# Patient Record
Sex: Female | Born: 1965 | Race: Black or African American | Hispanic: No | Marital: Single | Smoking: Never smoker
Health system: Southern US, Community
[De-identification: ages and names within clinical notes are randomized; demographics above are authoritative.]

## PROBLEM LIST (undated history)

## (undated) DIAGNOSIS — I10 Essential (primary) hypertension: Secondary | ICD-10-CM

---

## 2015-10-06 ENCOUNTER — Emergency Department: Admit: 2015-10-06 | Payer: Self-pay | Primary: Family Medicine

## 2015-10-06 ENCOUNTER — Inpatient Hospital Stay
Admit: 2015-10-06 | Discharge: 2015-10-07 | Disposition: A | Discharge status: Home / Self Care | Payer: Self-pay | Attending: Emergency Medicine

## 2015-10-06 DIAGNOSIS — M545 Low back pain: Secondary | ICD-10-CM

## 2015-10-06 LAB — CBC WITH AUTOMATED DIFF
ABS. BASOPHILS: 0 10*3/uL (ref 0.0–0.1)
ABS. EOSINOPHILS: 0 10*3/uL (ref 0.0–0.4)
ABS. LYMPHOCYTES: 1.5 10*3/uL (ref 0.8–3.5)
ABS. MONOCYTES: 0.5 10*3/uL (ref 0.0–1.0)
ABS. NEUTROPHILS: 2.1 10*3/uL (ref 1.8–8.0)
BASOPHILS: 1 % (ref 0–1)
EOSINOPHILS: 0 % (ref 0–7)
HCT: 42.6 % (ref 35.0–47.0)
HGB: 13.5 g/dL (ref 11.5–16.0)
LYMPHOCYTES: 36 % (ref 12–49)
MCH: 29.2 PG (ref 26.0–34.0)
MCHC: 31.7 g/dL (ref 30.0–36.5)
MCV: 92.2 FL (ref 80.0–99.0)
MONOCYTES: 13 % (ref 5–13)
NEUTROPHILS: 50 % (ref 32–75)
PLATELET: 165 10*3/uL (ref 150–400)
RBC: 4.62 M/uL (ref 3.80–5.20)
RDW: 15.4 % — ABNORMAL HIGH (ref 11.5–14.5)
WBC: 4.1 10*3/uL (ref 3.6–11.0)

## 2015-10-06 LAB — METABOLIC PANEL, BASIC
Anion gap: 11 mmol/L (ref 5–15)
BUN/Creatinine ratio: 12 (ref 12–20)
BUN: 19 MG/DL (ref 6–20)
CO2: 26 mmol/L (ref 21–32)
Calcium: 8.4 MG/DL — ABNORMAL LOW (ref 8.5–10.1)
Chloride: 104 mmol/L (ref 97–108)
Creatinine: 1.6 MG/DL — ABNORMAL HIGH (ref 0.55–1.02)
GFR est AA: 41 mL/min/{1.73_m2} — ABNORMAL LOW (ref >60)
GFR est non-AA: 34 mL/min/{1.73_m2} — ABNORMAL LOW (ref >60)
Glucose: 96 mg/dL (ref 65–100)
Potassium: 3.7 mmol/L (ref 3.5–5.1)
Sodium: 141 mmol/L (ref 136–145)

## 2015-10-06 MED ORDER — SODIUM CHLORIDE 0.9% BOLUS IV
0.9 % | INTRAVENOUS | Status: AC
Start: 2015-10-06 — End: 2015-10-06
  Administered 2015-10-06: 23:00:00 via INTRAVENOUS

## 2015-10-06 MED FILL — SODIUM CHLORIDE 0.9 % IV: INTRAVENOUS | Qty: 1000

## 2015-10-06 NOTE — ED Triage Notes (Addendum)
Pt reports generalized body aches/stiffness for "awhile". Pt reports it has gotten worse over the past 2 days. Pt reports reports 2 nights ago she got on the floor of the bathroom because her back was hurting, pt reports she was unable to get back up and was on the ground for 9 hours.

## 2015-10-06 NOTE — ED Notes (Signed)
Patient discharged by provider, given PCP referral as requested

## 2015-10-06 NOTE — ED Notes (Signed)
Patient given discharge instruction by provider.  Verbalized understanding, pt discharge home with family and was able to ambulate.

## 2015-10-06 NOTE — ED Provider Notes (Signed)
HPI Comments: 50 y.o. 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  The pt reports that she has felt generally weak since last April.  She further explained "I was walking to the bathroom 2 nights ago I had to sit down on the tile floor and I laid there for 9 hours."  She states that "my legs just feel weak."  She denies any numbness to her legs.  She denies any urinary retention.  There are no other acute medical complaints at this time.  Denies fever, chills, HA, dizziness, light headedness, dyspnea, chest pain, abdominal pain, N/V/D, melena, hematochezia, loss of bowel/bladder functioning, saddle anesthesia, urinary symptoms or any other acute medical conditions.      PCP: Manus Rudd, MD    Eliezer Champagne, PA-C      Patient is a 50 y.o. female presenting with general illness.   Generalized Body Aches   Pertinent negatives include no chest pain, no abdominal pain and no shortness of breath.        Past Medical History:   Diagnosis Date   ??? Arthritis    ??? Ill-defined condition     over active bladder       History reviewed. No pertinent surgical history.      History reviewed. No pertinent family history.    Social History     Social History   ??? Marital status: SINGLE     Spouse name: N/A   ??? Number of children: N/A   ??? Years of education: N/A     Occupational History   ??? Not on file.     Social History Main Topics   ??? Smoking status: Never Smoker   ??? Smokeless tobacco: Never Used   ??? Alcohol use Yes      Comment: occ   ??? Drug use: No   ??? Sexual activity: Not on file     Other Topics Concern   ??? Not on file     Social History Narrative   ??? No narrative on file         ALLERGIES: Review of patient's allergies indicates no known allergies.    Review of Systems   Constitutional: Negative for activity change, appetite change, diaphoresis and fever.   HENT: Negative for ear discharge, ear pain, facial swelling, rhinorrhea,  sore throat, tinnitus, trouble swallowing and voice change.    Eyes: Negative for photophobia, pain, discharge, redness and visual disturbance.   Respiratory: Negative for cough, chest tightness, shortness of breath, wheezing and stridor.    Cardiovascular: Negative for chest pain and palpitations.   Gastrointestinal: Negative for abdominal pain, constipation, diarrhea, nausea and vomiting.   Endocrine: Negative for polydipsia and polyuria.   Genitourinary: Negative for dysuria, flank pain and hematuria.   Musculoskeletal: Negative for arthralgias, back pain and myalgias.   Skin: Negative for color change and rash.   Neurological: Negative for dizziness, syncope, speech difficulty, light-headedness and numbness.   Psychiatric/Behavioral: Negative for behavioral problems.       Vitals:    10/06/15 1725   BP: (!) 147/91   Pulse: 88   Resp: 14   Temp: 98.1 ??F (36.7 ??C)   SpO2: 98%   Weight: 96.6 kg (213 lb)   Height:  (1.676 m)            Physical Exam   Constitutional: She is oriented to person, place, and time. She appears  well-developed and well-nourished.   HENT:   Head: Normocephalic and atraumatic.   Eyes: Conjunctivae are normal. Pupils are equal, round, and reactive to light. Right eye exhibits no discharge. Left eye exhibits no discharge.   Neck: Normal range of motion. Neck supple. No thyromegaly present.   Cardiovascular: Normal rate, regular rhythm and normal heart sounds.  Exam reveals no gallop and no friction rub.    No murmur heard.  Pulmonary/Chest: Effort normal and breath sounds normal. No respiratory distress. She has no wheezes.   Abdominal: Soft. Bowel sounds are normal. She exhibits no distension. There is no tenderness. There is no rebound and no guarding.   Musculoskeletal: Normal range of motion. She exhibits no edema, tenderness or deformity.   No C, T, L, S spine tenderness.  Pt has full mobility of upper and lower extremities.  Pt is able to ambulate without difficulty.   No perineal  numbness.  Pt is NVI.    Neurological: She is alert and oriented to person, place, and time.   Skin: Skin is warm.   Psychiatric: She has a normal mood and affect.        MDM  Number of Diagnoses or Management Options  Low back pain, unspecified back pain laterality, unspecified chronicity, with sciatica presence unspecified:   Diagnosis management comments: Pt presents with complaints of leg weakness.  Workup revealed elevated creatinine and possible UTI.  Will cover with keflex and refer to family doctor for further evaluation of symptoms.  Also provided name of rheumatologist.  Reviewed treatment plan with attending and they agree.  Eliezer Champagneavid B Ndia Sampath, PA-C     ED Course       Procedures

## 2015-10-06 NOTE — ED Notes (Signed)
Bedside and Verbal shift change report given to Elnita Maxwellheryl RN (oncoming nurse) by Laymond PurserKatie RN (offgoing nurse). Report included the following information SBAR, Kardex, ED Summary and MAR.

## 2015-10-07 LAB — URINALYSIS W/MICROSCOPIC
Blood: NEGATIVE
Glucose: NEGATIVE mg/dL
Nitrites: NEGATIVE
Protein: 30 mg/dL — AB
Specific gravity: 1.028 (ref 1.003–1.030)
Urobilinogen: 1 EU/dL (ref 0.2–1.0)
pH (UA): 5 (ref 5.0–8.0)

## 2015-10-07 LAB — CK: CK: 280 U/L — ABNORMAL HIGH (ref 26–192)

## 2015-10-07 LAB — BILIRUBIN, CONFIRM: Bilirubin UA, confirm: NEGATIVE

## 2015-10-07 MED ORDER — CEPHALEXIN 500 MG CAP
500 mg | ORAL_CAPSULE | Freq: Two times a day (BID) | ORAL | 0 refills | Status: AC
Start: 2015-10-07 — End: 2015-10-13

## 2015-10-12 ENCOUNTER — Encounter: Attending: Rheumatology | Primary: Family Medicine

## 2015-10-20 ENCOUNTER — Encounter: Attending: Rheumatology | Primary: Family Medicine

## 2015-12-26 ENCOUNTER — Ambulatory Visit: Admit: 2015-12-26 | Discharge: 2015-12-26 | Attending: Family Medicine | Primary: Family Medicine

## 2015-12-26 DIAGNOSIS — M25561 Pain in right knee: Secondary | ICD-10-CM

## 2015-12-26 NOTE — Progress Notes (Signed)
Chief Complaint   Patient presents with   ??? Establish Care   ??? Form Completion     1. Have you been to the ER, urgent care clinic since your last visit?  Hospitalized since your last visit? N/A    2. Have you seen or consulted any other health care providers outside of the [facility] since your last visit?  Include any pap smears or colon screening. N/A

## 2015-12-26 NOTE — Patient Instructions (Signed)
Mediterranean Diet: Care Instructions  Your Care Instructions  The Mediterranean diet features foods eaten in [place], [place], southern [place] and [place], and other countries that border the [place] Sea. It emphasizes eating a diet rich in fruits, vegetables, nuts, and high-fiber grains, and limits meat, cheese, and sweets.  The Mediterranean diet may:  ?? Prevent heart disease and lower the risk of a heart attack or stroke.  ?? Prevent type 2 diabetes.  ?? Prevent Alzheimer's disease and other dementia.  ?? Prevent depression.  ?? Prevent Parkinson's disease.  This diet contains more fat than other heart-healthy diets. But the fats are mainly from nuts, unsaturated oils, such as fish oils, olive oil, and certain nut or seed oils (such as canola, soybean, or flaxseed oil). These types of oils may help protect the heart and blood vessels.  Follow-up care is a key part of your treatment and safety. Be sure to make and go to all appointments, and call your doctor if you are having problems. It's also a good idea to know your test results and keep a list of the medicines you take.  How can you care for yourself at home?  What to eat  ?? Eat a variety of fruits and vegetables each day, such as grapes, blueberries, tomatoes, broccoli, peppers, figs, olives, spinach, eggplant, beans, lentils, and chickpeas.  ?? Eat a variety of whole-grain foods each day, such as oats, brown rice, and whole wheat bread, pasta, and couscous.  ?? Eat fish at least 2 times a week. Try tuna, salmon, mackerel, lake trout, herring, or sardines.  ?? Eat moderate amounts of low-fat dairy products, such as milk, cheese, or yogurt.  ?? Eat moderate amounts of poultry and eggs.  ?? Choose healthy (unsaturated) fats, such as nuts, olive oil and certain nut or seed oils like canola, soybean, and flaxseed.  ?? Limit unhealthy (saturated) fats, such as butter, palm oil, and coconut oil. And limit fats found in animal products, such as meat and dairy  products made with whole milk. Try to eat red meat only a few times a month in very small amounts.  ?? Limit sweets and desserts to only a few times a week. This includes sugar-sweetened drinks like soda.  The Mediterranean diet may also include red wine with your meal???1 glass each day for women and up to 2 glasses a day for men.  Tips for changing your diet  ?? Dip bread in a mix of olive oil and fresh herbs instead of using butter.  ?? Add avocado slices to your sandwich instead of bacon.  ?? Have fish for lunch or dinner instead of red meat. Brush the fish with olive oil, and broil or grill it.  ?? Sprinkle your salad with seeds or nuts instead of cheese.  ?? Cook with olive or canola oil instead of butter or oils that are high in saturated fat.  ?? Switch from 2% milk or whole milk to 1% or fat-free milk.  ?? Dip raw vegetables in a vinaigrette dressing or hummus instead of dips made from mayonnaise or sour cream.  ?? Have a piece of fruit for dessert instead of a piece of cake. Try baked apples, or have some dried fruit.  Part of the Mediterranean diet is being active. Get at least 30 minutes of exercise on most days of the week. Walking is a good choice. You also may want to do other activities, such as running, swimming, cycling, or playing tennis or team sports.    Where can you learn more?  Go to http://www.healthwise.net/[employer].  Enter O407 in the search box to learn more about "Mediterranean Diet: Care Instructions."  Current as of: May 06, 2015  Content Version: 11.2  ?? 2006-2017 Healthwise[employer]. Care instructions adapted under license by [employer] (which disclaims liability or warranty for this information). If you have questions about a medical condition or this instruction, always ask your healthcare professional. Healthwise, Incorporated disclaims any warranty or liability for your use of this information.

## 2015-12-26 NOTE — Progress Notes (Addendum)
Linda Jennings is an 50 y.o. female who presents with chief concern of driving evaluation.    She was new to area in [place] [facility] and missed a turn going to a friends house.  She turned down a wrong street and was pulled over by police.  Police officer asked her and evaluated for DUI.  She had a negative Breath analyzer. During the straight line test, she had difficulty and mentioned she had severe acute knee pain after colliding with a metal cart at work earlier that week.     Officer put in to [facility] that she needed evaluation for knee pain.   She since moved and didn't get her mail initially and now has a document stating her licence is being revoked.  She is here for evaluation and states she has no pain.      Obesity:  Body mass index is 41.64 kg/(m^2). is very high.  She states she has not been compliant with her exercise but is trying to get back in shape.  Motivation is "high" for weight loss"      Detailed ROS below.       Review of Systems, positives are bolded, strike through if denied.    GEN:    CV:      Pulm:    Abd/GI:   Psych:    Neuro:     ENT:      Endocrine:     GU:      MSK:      Skin:    Lower Ext:           Current and past medical information:  I personally reviewed and included updated list below.    Past Medical History:   Diagnosis Date   ??? Arthritis    ??? Ill-defined condition     over active bladder       No Known Allergies    History reviewed. No pertinent surgical history.    Social History     Social History   ??? Marital status: UNKNOWN     Spouse name: N/A   ??? Number of children: N/A   ??? Years of education: N/A     Social History Main Topics   ??? Smoking status: Never Smoker   ??? Smokeless tobacco: Never Used   ??? Alcohol use Yes      Comment: occ   ??? Drug use: No   ??? Sexual activity: Not Asked     Other Topics Concern   ??? None     Social History Narrative           Physical Exam, Abnormal/pertinent findings bolded,     Visit Vitals   ??? BP 128/87   ??? Pulse 63    ??? Temp 97.3 ??F (36.3 ??C) (Oral)   ??? Resp 16   ??? Ht  (1.676 m)   ??? Wt 258 lb (117 kg)   ??? SpO2 95%   ??? BMI 41.64 kg/m2          GEN:    Alert and Oriented, No acute distress  Psych:  Mood appropriate, No pressured speech, linear thoughts  CV:    Regular Rhythm, S1 and S2 audible, no MRG, no palpable thrills  Pulm:    CTA B/L, no wheezes/rubs  GI/Abd:   Non-tender to palpation, normal bowel sounds x4, no masses, (-) involuntary or voluntary guarding  Neuro:   Lucid, No focal deficits  ENT:    EOMI, Non-icteric sclera, MMM  Neck:   Trachea midline, no lymphadenopathy  GU:    No suprapubic tenderness  MSK:  Normal varus and valgus testing, negative McMurray, no joint line tenderness. Normal gait, FROM in all four extremities  Skin:   No visible Rash, Ecchymosis, or Excoriations  Lower Ext: No edema, No tenderness to palpation, No palpable cords        Assessment/PLAN    1. Right knee pain, unspecified chronicity  Resolved.  She has no active knee pain.  - dmv document completed and to be scanned to chart.     2. Essential hypertension  BP initially elevated. Improved with rest. Continue to monitor.     3. Obesity due to excess calories, unspecified obesity severity  - Discussed weight loss plan in detail.  She is interested in healthy lifestyle and given information of Mediterranean diet.   I have reviewed/discussed the above normal BMI with the patient.  I have recommended the following interventions: dietary management education, guidance, and counseling, encourage exercise, monitor weight and prescribed dietary intake . Marland Kitchen.      Follow-up Disposition: Not on File  For next visit follow up weight management.     Plan of care:  Discussed diagnoses in detail with patient.   Medication risks/benefits/side effects discussed with patient.   All of the patient's questions were addressed. The patient understands and agrees with our plan of care.  The patient knows to call back if they are unsure of or forget any changes  we discussed today or if the symptoms change.     The patient received an After-Visit Summary which contains VS, orders, medication list and allergy list. This can be used as a "mini-medical record" should they have to seek medical care while out of town.    Gilles ChiquitoJonathan W Gilberto Stanforth, DO  Resident note, PGY-3  Patient discussed with Precept Physician, Dr. Bunnie PhilipsMarcee Vest    No future appointments.        Current Medications after this visit::       Current Outpatient Prescriptions   Medication Sig   ??? oxybutynin (DITROPAN) 5 mg tablet Take 5 mg by mouth three (3) times daily.   ??? diclofenac EC (VOLTAREN) 75 mg EC tablet Take 75 mg by mouth.     No current facility-administered medications for this visit.

## 2015-12-26 NOTE — Progress Notes (Signed)
I reviewed with the resident the medical history and the resident's findings on the physical examination.  I discussed with the resident the patient's diagnosis and concur with the plan.

## 2016-02-06 ENCOUNTER — Ambulatory Visit: Admit: 2016-02-06 | Discharge: 2016-02-06 | Attending: Family Medicine | Primary: Family Medicine

## 2016-02-06 DIAGNOSIS — G8929 Other chronic pain: Secondary | ICD-10-CM

## 2016-02-06 NOTE — Patient Instructions (Signed)
You can go to optometrist at [facility] as it may be cheaper than seeing an opthalmologist

## 2016-02-06 NOTE — Progress Notes (Signed)
I reviewed with the resident the medical history and the resident's findings on the physical examination.  I discussed with the resident the patient's diagnosis and concur with the plan.

## 2016-02-06 NOTE — Progress Notes (Signed)
Visit Vitals   ??? BP (!) 145/96   ??? Pulse 63   ??? Temp 97.7 ??F (36.5 ??C) (Oral)   ??? Resp 20   ??? Ht 5\' 6"  (1.676 m)   ??? Wt 261 lb (118.4 kg)   ??? SpO2 97%   ??? BMI 42.13 kg/m2     Chief Complaint   Patient presents with   ??? 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

## 2016-02-06 NOTE — Progress Notes (Signed)
Subjective  Linda Jennings is an 50 y.o. female .    Patient presents for evaluation.    She previously saw Dr. Derenda Fennel to have forms for [facility] completed.     [place] and was going to her friends house when she missed the turn and went down the wrong street. A police officer noticed her and stopped her for concern for DUI. She had a negative breath analyzer. She had difficulty during the straihgt line test but she states it is because of chronic knee pain that she's had.     She was deemed to have failed the sobriety test and her license was revoked pending medical eval.    Dr. Derenda Fennel completed paperwork on June 12th 2017; however some parts are missing and DMV needs more info provided.      Allergies - reviewed:   No Known Allergies      Medications - reviewed:   Current Outpatient Prescriptions   Medication Sig   ??? oxybutynin (DITROPAN) 5 mg tablet Take 5 mg by mouth three (3) times daily.   ??? diclofenac EC (VOLTAREN) 75 mg EC tablet Take 75 mg by mouth.     No current facility-administered medications for this visit.          Past Medical History - reviewed:  Past Medical History:   Diagnosis Date   ??? Arthritis    ??? Ill-defined condition     over active bladder         Immunizations - reviewed:     There is no immunization history on file for this patient.      ROS  Review of Systems : A complete review of systems as performed and is negative except for those mentioned in the HPI.    Physical Exam  Visit Vitals   ??? BP (!) 145/96   ??? Pulse 63   ??? Temp 97.7 ??F (36.5 ??C) (Oral)   ??? Resp 20   ??? Ht 5\' 6"  (1.676 m)   ??? Wt 261 lb (118.4 kg)   ??? SpO2 97%   ??? BMI 42.13 kg/m2       General appearance - Alert, NAD.   Respiratory - LCTAB. No wheeze/rale/rhonchi  Heart - Normal rate, regular rhythm. No m/r/r  Neurological - No focal deficits. Speech normal.   Musculoskeletal - Normal ROM, Gait normal.    Extremities - No LE edema. Crepitus in left patella   Psych - mood stressed due to all of this but happy overall. Affect pleasant. No hi or si. Thought content linear. Judgement and insight seem fair    Assessment/Plan  1. Chronic pain of right knee: resolved. Could have contributed to unsteady gait that night especially given negative breathalyzer.  Completed missing pieces to forms today  PMP reviewed and nothing popped up, so no narcotic use documented in [place] area  Patient will go to optho for eye/vision portion of paperwork that must be completed    Follow up as needed    I discussed the aforementioned diagnoses with the patient as well as the plan of care.     Benjamine Mola, MD  Family Medicine Resident  PGY 3

## 2016-03-07 NOTE — Telephone Encounter (Signed)
Per Dr. Tiburcio PeaHarris, Lauren has already taken care of this request.

## 2016-03-07 NOTE — Telephone Encounter (Signed)
Per call from Patient, DMV forms were completed and faxed for her, however DMV reports missing information on form. Patient states they have faxed form here  About 10 minutes ago and asking this be completed ASAP. Patient states there is still a hold on her license because of missing information and patient states she need to work.    [facility] call pt [phone]    thanks

## 2016-10-18 ENCOUNTER — Emergency Department (HOSPITAL_COMMUNITY): Payer: Self-pay

## 2016-10-18 ENCOUNTER — Emergency Department (HOSPITAL_COMMUNITY)
Admission: EM | Admit: 2016-10-18 | Discharge: 2016-10-19 | Disposition: A | Discharge status: Home / Self Care | Payer: Self-pay | Attending: Emergency Medicine | Admitting: Emergency Medicine

## 2016-10-18 DIAGNOSIS — M791 Myalgia, unspecified site: Secondary | ICD-10-CM

## 2016-10-18 DIAGNOSIS — N390 Urinary tract infection, site not specified: Secondary | ICD-10-CM | POA: Insufficient documentation

## 2016-10-18 DIAGNOSIS — R42 Dizziness and giddiness: Secondary | ICD-10-CM | POA: Insufficient documentation

## 2016-10-18 LAB — COMPREHENSIVE METABOLIC PANEL
ALBUMIN: 3.5 g/dL (ref 3.5–5.0)
ALK PHOS: 86 U/L (ref 38–126)
ALT: 21 U/L (ref 14–54)
ANION GAP: 8 (ref 5–15)
AST: 23 U/L (ref 15–41)
BILIRUBIN TOTAL: 0.5 mg/dL (ref 0.3–1.2)
BUN: 11 mg/dL (ref 6–20)
CALCIUM: 8.9 mg/dL (ref 8.9–10.3)
CO2: 25 mmol/L (ref 22–32)
CREATININE: 0.96 mg/dL (ref 0.44–1.00)
Chloride: 108 mmol/L (ref 101–111)
GFR calc Af Amer: >60 mL/min (ref >60)
GFR calc non Af Amer: >60 mL/min (ref >60)
GLUCOSE: 76 mg/dL (ref 65–99)
Potassium: 3.6 mmol/L (ref 3.5–5.1)
Sodium: 141 mmol/L (ref 135–145)
TOTAL PROTEIN: 7.2 g/dL (ref 6.5–8.1)

## 2016-10-18 LAB — URINALYSIS, ROUTINE W REFLEX MICROSCOPIC
BILIRUBIN URINE: NEGATIVE
Glucose, UA: NEGATIVE mg/dL
KETONES UR: 5 mg/dL — AB
Nitrite: NEGATIVE
PROTEIN: 30 mg/dL — AB
Specific Gravity, Urine: 1.028 (ref 1.005–1.030)
pH: 5 (ref 5.0–8.0)

## 2016-10-18 LAB — CBC WITH DIFFERENTIAL/PLATELET
BASOS ABS: 0 10*3/uL (ref 0.0–0.1)
BASOS PCT: 0 %
Eosinophils Absolute: 0.2 10*3/uL (ref 0.0–0.7)
Eosinophils Relative: 4 %
HEMATOCRIT: 40.4 % (ref 36.0–46.0)
HEMOGLOBIN: 13.2 g/dL (ref 12.0–15.0)
LYMPHS PCT: 32 %
Lymphs Abs: 1.9 10*3/uL (ref 0.7–4.0)
MCH: 30.3 pg (ref 26.0–34.0)
MCHC: 32.7 g/dL (ref 30.0–36.0)
MCV: 92.7 fL (ref 78.0–100.0)
MONO ABS: 0.3 10*3/uL (ref 0.1–1.0)
Monocytes Relative: 6 %
NEUTROS ABS: 3.4 10*3/uL (ref 1.7–7.7)
NEUTROS PCT: 58 %
Platelets: 220 10*3/uL (ref 150–400)
RBC: 4.36 MIL/uL (ref 3.87–5.11)
RDW: 14.7 % (ref 11.5–15.5)
WBC: 5.8 10*3/uL (ref 4.0–10.5)

## 2016-10-18 LAB — CK: Total CK: 291 U/L — ABNORMAL HIGH (ref 38–234)

## 2016-10-18 MED ORDER — CEPHALEXIN 250 MG PO CAPS
500.0000 mg | ORAL_CAPSULE | Freq: Once | ORAL | Status: AC
  Administered 2016-10-18: 500 mg via ORAL
  Filled 2016-10-18: qty 2

## 2016-10-18 MED ORDER — CEPHALEXIN 500 MG PO CAPS: ORAL_CAPSULE | ORAL | 0 refills | Status: DC

## 2016-10-18 NOTE — ED Notes (Signed)
Patient transported to CT 

## 2016-10-18 NOTE — ED Notes (Signed)
Family expressed concern about pt still being unbalanced and requested for MD to return to room.

## 2016-10-18 NOTE — ED Triage Notes (Signed)
Pt c/o stiffness in her legs making it unable to walk without asst,.  Pt also has bil feet and ankle swelling.  Pt st's onset of stiffness over a year ago but has gotten worse over past few weeks.

## 2016-10-18 NOTE — ED Notes (Signed)
MD at bedside. 

## 2016-10-19 NOTE — ED Provider Notes (Signed)
[facility] DEPT Provider Note   CSN: [id number] Arrival date & time: 10/18/16  1633     History   Chief Complaint Chief Complaint  Patient presents with  . Leg Swelling    HPI Brittany Hartman is a 51 y.o. female.  HPI  On initial examination patient states that she is here for 2 years of progressively worsening leg swelling and overall weakness. Since she is to build exercise and then she's been too weak exercise over the last couple years. Doesn't have any shortness of breath or chest pain. No history of heart failure. She's been evaluated for this in the past until his arthritis. She does move the area and seems to get worse so she came here for further evaluation.  Time of discharge her cousin stated that she was really here because she was having balance issues and was walking "funny". This apparently had been progressively worsening as well. Her cousin states that sometimes her leg will get stiff and she can't move it.  No past medical history on file.  There are no active problems to display for this patient.   No past surgical history on file.  OB History    No data available       Home Medications    Prior to Admission medications   Medication Sig Start Date End Date Taking? Authorizing Provider  cephALEXin (KEFLEX) 500 MG capsule 2 caps po bid x 7 days 10/18/16   Marily Memos, MD    Family History No family history on file.  Social History Social History  Substance Use Topics  . Smoking status: Not on file  . Smokeless tobacco: Not on file  . Alcohol use Not on file     Allergies   Patient has no known allergies.   Review of Systems Review of Systems  All other systems reviewed and are negative.    Physical Exam Updated Vital Signs BP (!) 178/99   Pulse (!) 58   Temp 97.9 F (36.6 C) (Oral)   Resp 18   Ht 5\' 6"  (1.676 m)   Wt 225 lb (102.1 kg)   SpO2 100%   BMI 36.32 kg/m   Physical Exam  Constitutional: She is oriented to  person, place, and time. She appears well-developed and well-nourished.  HENT:  Head: Normocephalic and atraumatic.  Eyes: Conjunctivae and EOM are normal.  Neck: Normal range of motion.  Cardiovascular: Normal rate and regular rhythm.   Pulmonary/Chest: No stridor. No respiratory distress.  Abdominal: Soft. She exhibits no distension.  Musculoskeletal: Normal range of motion. She exhibits no edema or deformity.  Neurological: She is alert and oriented to person, place, and time.  No altered mental status, able to give full seemingly accurate history.  Face is symmetric, EOM's intact, pupils equal and reactive, vision intact, tongue and uvula midline without deviation Upper and Lower extremity motor 5/5, intact pain perception in distal extremities, 2+ reflexes in biceps, patella and achilles tendons. Finger to nose normal, heel to shin normal. Walks without assistance or evident ataxia but has a slightly wide, shuffling type of gait with both feet, holds on to walls to move.    Skin: Skin is warm and dry.  Nursing note and vitals reviewed.    ED Treatments / Results  Labs (all labs ordered are listed, but only abnormal results are displayed) Labs Reviewed  CK - Abnormal; Notable for the following:       Result Value   Total CK 291 (*)  All other components within normal limits  URINALYSIS, ROUTINE W REFLEX MICROSCOPIC - Abnormal; Notable for the following:    Color, Urine AMBER (*)    APPearance TURBID (*)    Hgb urine dipstick MODERATE (*)    Ketones, ur 5 (*)    Protein, ur 30 (*)    Leukocytes, UA TRACE (*)    Bacteria, UA MANY (*)    Squamous Epithelial / LPF 6-30 (*)    All other components within normal limits  URINE CULTURE  CBC WITH DIFFERENTIAL/PLATELET  COMPREHENSIVE METABOLIC PANEL    EKG  EKG Interpretation None       Radiology Ct Head Wo Contrast  Result Date: 10/18/2016 CLINICAL DATA:  Lightheadedness and vertigo. EXAM: CT HEAD WITHOUT CONTRAST  TECHNIQUE: Contiguous axial images were obtained from the base of the skull through the vertex without intravenous contrast. COMPARISON:  None. FINDINGS: Brain: There is no intracranial hemorrhage, mass or evidence of acute infarction. There is mild generalized atrophy. There is mild chronic microvascular ischemic change. There is no significant extra-axial fluid collection. Benign hyperostosis calcifications incidentally noted. No acute intracranial findings are evident. Vascular: No hyperdense vessel or unexpected calcification. Skull: Normal. Negative for fracture or focal lesion. Sinuses/Orbits: No acute finding. Other: None. IMPRESSION: No acute intracranial findings. There is mild generalized atrophy and chronic appearing white matter hypodensities which likely represent small vessel ischemic disease. Electronically Signed   By: Ellery Plunk M.D.   On: 10/18/2016 23:50    Procedures Procedures (including critical care time)  Medications Ordered in ED Medications  cephALEXin (KEFLEX) capsule 500 mg (500 mg Oral Given 10/18/16 2245)     Initial Impression / Assessment and Plan / ED Course  I have reviewed the triage vital signs and the nursing notes.  Pertinent labs & imaging results that were available during my care of the patient were reviewed by me and considered in my medical decision making (see chart for details).     Workup in the ER is negative. Considered Guillain-Barr as a possible cause but over 2 years think this is unlikely. Patient could have some atypical form of multiple sclerosis but would need neurology follow-up for that.  She could also have some mild muscular dystrophy with her slightly elevated CK and proximal leg symptoms but this is also something that neurology can follow-up.  Her CT here is negative and don't think at the central nervous cause such as stroke or brain bleed especially with her chronicity.  Information for neurology given for which the cousin  will help the patient follow-up. Otherwise return here for any new or worsening symptoms.  Final Clinical Impressions(s) / ED Diagnoses   Final diagnoses:  Muscle soreness  Urinary tract infection without hematuria, site unspecified    New Prescriptions Discharge Medication List as of 10/18/2016 11:57 PM    START taking these medications   Details  cephALEXin (KEFLEX) 500 MG capsule 2 caps po bid x 7 days, Print         Marily Memos, MD 10/19/16 2214

## 2016-10-20 LAB — URINE CULTURE

## 2016-10-24 ENCOUNTER — Encounter (HOSPITAL_COMMUNITY): Payer: Self-pay

## 2016-10-24 ENCOUNTER — Inpatient Hospital Stay (HOSPITAL_COMMUNITY)
Admission: EM | Admit: 2016-10-24 | Discharge: 2016-10-29 | DRG: 059 | Disposition: A | Discharge status: Rehabilitation Facility | Payer: Self-pay | Attending: Internal Medicine | Admitting: Internal Medicine

## 2016-10-24 ENCOUNTER — Emergency Department (HOSPITAL_COMMUNITY): Payer: Self-pay

## 2016-10-24 DIAGNOSIS — R269 Unspecified abnormalities of gait and mobility: Secondary | ICD-10-CM

## 2016-10-24 DIAGNOSIS — Z8249 Family history of ischemic heart disease and other diseases of the circulatory system: Secondary | ICD-10-CM

## 2016-10-24 DIAGNOSIS — I10 Essential (primary) hypertension: Secondary | ICD-10-CM | POA: Diagnosis present

## 2016-10-24 DIAGNOSIS — R531 Weakness: Secondary | ICD-10-CM

## 2016-10-24 DIAGNOSIS — G35D Multiple sclerosis, unspecified: Secondary | ICD-10-CM | POA: Diagnosis present

## 2016-10-24 DIAGNOSIS — N183 Chronic kidney disease, stage 3 unspecified: Secondary | ICD-10-CM

## 2016-10-24 DIAGNOSIS — R739 Hyperglycemia, unspecified: Secondary | ICD-10-CM

## 2016-10-24 DIAGNOSIS — N319 Neuromuscular dysfunction of bladder, unspecified: Secondary | ICD-10-CM

## 2016-10-24 DIAGNOSIS — N3001 Acute cystitis with hematuria: Secondary | ICD-10-CM | POA: Diagnosis present

## 2016-10-24 DIAGNOSIS — M62838 Other muscle spasm: Secondary | ICD-10-CM

## 2016-10-24 DIAGNOSIS — G35 Multiple sclerosis: Principal | ICD-10-CM | POA: Diagnosis present

## 2016-10-24 DIAGNOSIS — T380X5A Adverse effect of glucocorticoids and synthetic analogues, initial encounter: Secondary | ICD-10-CM

## 2016-10-24 DIAGNOSIS — E876 Hypokalemia: Secondary | ICD-10-CM | POA: Diagnosis not present

## 2016-10-24 DIAGNOSIS — K592 Neurogenic bowel, not elsewhere classified: Secondary | ICD-10-CM

## 2016-10-24 HISTORY — DX: Essential (primary) hypertension: I10

## 2016-10-24 LAB — CBC WITH DIFFERENTIAL/PLATELET
Basophils Absolute: 0 10*3/uL (ref 0.0–0.1)
Basophils Relative: 0 %
Eosinophils Absolute: 0.2 10*3/uL (ref 0.0–0.7)
Eosinophils Relative: 2 %
HCT: 43.2 % (ref 36.0–46.0)
HEMOGLOBIN: 14.1 g/dL (ref 12.0–15.0)
LYMPHS ABS: 2.3 10*3/uL (ref 0.7–4.0)
LYMPHS PCT: 36 %
MCH: 30.1 pg (ref 26.0–34.0)
MCHC: 32.6 g/dL (ref 30.0–36.0)
MCV: 92.3 fL (ref 78.0–100.0)
MONOS PCT: 5 %
Monocytes Absolute: 0.3 10*3/uL (ref 0.1–1.0)
NEUTROS PCT: 57 %
Neutro Abs: 3.6 10*3/uL (ref 1.7–7.7)
Platelets: 204 10*3/uL (ref 150–400)
RBC: 4.68 MIL/uL (ref 3.87–5.11)
RDW: 14.3 % (ref 11.5–15.5)
WBC: 6.4 10*3/uL (ref 4.0–10.5)

## 2016-10-24 LAB — COMPREHENSIVE METABOLIC PANEL
ALT: 28 U/L (ref 14–54)
AST: 21 U/L (ref 15–41)
Albumin: 4 g/dL (ref 3.5–5.0)
Alkaline Phosphatase: 93 U/L (ref 38–126)
Anion gap: 13 (ref 5–15)
BUN: 13 mg/dL (ref 6–20)
CHLORIDE: 105 mmol/L (ref 101–111)
CO2: 26 mmol/L (ref 22–32)
Calcium: 9.7 mg/dL (ref 8.9–10.3)
Creatinine, Ser: 1.01 mg/dL — ABNORMAL HIGH (ref 0.44–1.00)
GFR calc Af Amer: >60 mL/min (ref >60)
Glucose, Bld: 92 mg/dL (ref 65–99)
POTASSIUM: 3.5 mmol/L (ref 3.5–5.1)
SODIUM: 144 mmol/L (ref 135–145)
Total Bilirubin: 0.7 mg/dL (ref 0.3–1.2)
Total Protein: 7.9 g/dL (ref 6.5–8.1)

## 2016-10-24 LAB — URINALYSIS, ROUTINE W REFLEX MICROSCOPIC
Bilirubin Urine: NEGATIVE
Glucose, UA: NEGATIVE mg/dL
Ketones, ur: NEGATIVE mg/dL
NITRITE: NEGATIVE
PH: 6 (ref 5.0–8.0)
Protein, ur: NEGATIVE mg/dL
SPECIFIC GRAVITY, URINE: 1.024 (ref 1.005–1.030)

## 2016-10-24 LAB — ETHANOL: Alcohol, Ethyl (B): <5 mg/dL (ref <5)

## 2016-10-24 MED ORDER — SODIUM CHLORIDE 0.9 % IV SOLN
500.0000 mg | Freq: Two times a day (BID) | INTRAVENOUS | Status: DC
  Administered 2016-10-25 – 2016-10-28 (×8): 500 mg via INTRAVENOUS
  Filled 2016-10-24 (×9): qty 4

## 2016-10-24 NOTE — ED Triage Notes (Signed)
Pt presents to the ed because her legs gave out from under her, states that her legs are very stiff in the mornings.  She has been having this problem on and off for 6 months.  Today she was walking and her legs got stiff and gave out from under her again.  Denies any other symptoms such as dizziness, chest pain, shortness of breath or weakness.

## 2016-10-24 NOTE — ED Provider Notes (Signed)
Emergency Department Provider Note   I have reviewed the triage vital signs and the nursing notes.   HISTORY  Chief Complaint Gait Problem   HPI Brittany Hartman is a 51 y.o. female presents to the emergency department for evaluation of gait instability and frequent falling. Patient states that she was seen in the emergency department recently and had a normal CT scan of the head and lab work. She was referred to primary care physician who she was supposed to see today but lost her balance and fell to the ground prior to this appointment and was transported by EMS to the emergency department. During the encounter today the patient states she was going down the stairs when she suddenly lost her balance and fell forward. She denies any syncope, loss of consciousness, head trauma. No back or neck pain. No pain in the arms or legs. She did not have any chest pain, difficulty breathing, or heart palpitations prior to the fall.   Patient states that her balance and gait issues have been ongoing for at least 6 months. She denies any numbness or weakness in the arms or legs. She had plans to follow up with the primary care physician today regarding control of her blood pressure. No sudden worsening back pain, fever, or chills.    Past Medical History:  Diagnosis Date  . Hypertension     Patient Active Problem List   Diagnosis Date Noted  . Multiple sclerosis (HCC) 10/24/2016  . Essential hypertension 10/24/2016  . Acute cystitis with hematuria 10/24/2016    History reviewed. No pertinent surgical history.    Allergies Patient has no known allergies.  Family History  Problem Relation Age of Onset  . Heart attack Mother   . Hypertension Brother     Social History Social History  Substance Use Topics  . Smoking status: Never Smoker  . Smokeless tobacco: Never Used  . Alcohol use No    Review of Systems  Constitutional: No fever/chills Eyes: No visual changes. ENT: No  sore throat. Cardiovascular: Denies chest pain. Respiratory: Denies shortness of breath. Gastrointestinal: No abdominal pain.  No nausea, no vomiting.  No diarrhea.  No constipation. Genitourinary: Negative for dysuria. Musculoskeletal: Negative for back pain.  Skin: Negative for rash. Neurological: Negative for headaches and numbness. Positive LE weakness and significant difficulty walking.   10-point ROS otherwise negative.  ____________________________________________   PHYSICAL EXAM:  VITAL SIGNS: ED Triage Vitals  Enc Vitals Group     BP 10/24/16 1611 (!) 184/109     Pulse Rate 10/24/16 1611 (!) 54     Resp 10/24/16 1611 18     Temp 10/24/16 1611 97.9 F (36.6 C)     Temp Source 10/24/16 1611 Oral     SpO2 10/24/16 1611 98 %     Weight 10/24/16 1610 225 lb (102.1 kg)     Height 10/24/16 1610 5\' 8"  (1.727 m)   Constitutional: Alert and oriented. Well appearing and in no acute distress. Eyes: Conjunctivae are normal. PERRL. EOMI. Head: Atraumatic. Nose: No congestion/rhinnorhea. Mouth/Throat: Mucous membranes are moist.   Neck: No stridor.   Cardiovascular: Normal rate, regular rhythm. Good peripheral circulation. Grossly normal heart sounds.   Respiratory: Normal respiratory effort.  No retractions. Lungs CTAB. Gastrointestinal: Soft and nontender. No distention.  Musculoskeletal: No lower extremity tenderness nor edema. No gross deformities of extremities. Neurologic:  Normal speech and language. No weakness/numbness in the upper or lower extremities. Normal CN exam 2-12. 2+ patellar reflexes  bilaterally. Significant staggering, wide-based gait.  Skin:  Skin is warm, dry and intact. No rash noted. Psychiatric: Mood and affect are normal. Speech and behavior are normal.  ____________________________________________   LABS (all labs ordered are listed, but only abnormal results are displayed)  Labs Reviewed  COMPREHENSIVE METABOLIC PANEL - Abnormal; Notable for the  following:       Result Value   Creatinine, Ser 1.01 (*)    All other components within normal limits  URINALYSIS, ROUTINE W REFLEX MICROSCOPIC - Abnormal; Notable for the following:    Color, Urine AMBER (*)    APPearance TURBID (*)    Hgb urine dipstick MODERATE (*)    Leukocytes, UA TRACE (*)    Bacteria, UA MANY (*)    Squamous Epithelial / LPF 6-30 (*)    All other components within normal limits  URINE CULTURE  CBC WITH DIFFERENTIAL/PLATELET  ETHANOL  CBC  ANTINUCLEAR ANTIBODIES, IFA  SJOGRENS SYNDROME-A EXTRACTABLE NUCLEAR ANTIBODY  SJOGRENS SYNDROME-B EXTRACTABLE NUCLEAR ANTIBODY  HIV ANTIBODY (ROUTINE TESTING)  HIV ANTIBODY (ROUTINE TESTING)   ____________________________________________  EKG   EKG Interpretation  Date/Time:  Wednesday October 24 2016 19:30:26 EDT Ventricular Rate:  57 PR Interval:  132 QRS Duration: 72 QT Interval:  424 QTC Calculation: 412 R Axis:   3 Text Interpretation:  Sinus bradycardia Septal infarct , age undetermined Abnormal ECG No STEMI.  Confirmed by Kinsler Soeder MD, Elianna Windom [phone]) on 10/24/2016 11:43:31 PM Also confirmed by Palmer Shorey MD, Braycen Burandt [phone]), editor [place]  CCT, [place] ([id number])  on 10/25/2016 7:45:56 AM       ____________________________________________  RADIOLOGY  Mr Laqueta Jean Wo Contrast  Result Date: 10/24/2016 CLINICAL DATA:  Leg stiffness, legs gave out today. Similar symptoms intermittently for 6 months. Evaluate gait abnormality. History of hypertension. EXAM: MRI HEAD WITHOUT CONTRAST TECHNIQUE: Multiplanar, multiecho pulse sequences of the brain and surrounding structures were obtained without intravenous contrast. COMPARISON:  CT HEAD October 18, 2016 FINDINGS: BRAIN: At least 6 infratentorial white matter lesions (including brainstem) infarct greater than 10 supratentorial white matter lesions which predominately radiate from the periventricular margin with low T1 signal compatible with black holes of demyelination. Multifocal  T2 shine through and scattered areas of equivocal, possibly hyperacute demyelination. Faint enhancement within the LEFT superior cerebellar peduncle, RIGHT periventricular frontal lobe, RIGHT frontal lobe, rostrum of the corpus callosum and to lesser extent along the periphery of LEFT periatrial white matter lesion. 7 mm LEFT basal ganglia lesion. LEFT parietal small cortical lesion. Dominant 11 mm LEFT frontal lobe lesion. Mild parenchymal brain volume loss for age. A few scattered chronic micro hemorrhages. No abnormal extra-axial fluid collections cord enhancement. VASCULAR: Normal major intracranial vascular flow voids present at skull base. SKULL AND UPPER CERVICAL SPINE: No abnormal sellar expansion. No suspicious calvarial bone marrow signal. Craniocervical junction maintained. SINUSES/ORBITS: The mastoid air-cells and included paranasal sinuses are well-aerated. The included ocular globes and orbital contents are non-suspicious. OTHER: None. IMPRESSION: Severe chronic supra- and infratentorial demyelination with superimposed hyperacute and acute demyelinating lesions including LEFT superior cerebellar peduncle. Mild parenchymal brain volume loss for age. Electronically Signed   By: Awilda Metro M.D.   On: 10/24/2016 21:32    ____________________________________________   PROCEDURES  Procedure(s) performed:   Procedures  None ____________________________________________   INITIAL IMPRESSION / ASSESSMENT AND PLAN / ED COURSE  Pertinent labs & imaging results that were available during my care of the patient were reviewed by me and considered in my medical decision making (see chart  for details).  Patient resents to the emergency department for reevaluation of gait instability. In review of her recent ED visit the patient had gait instability noted there with negative head CT and lab work. She was referred to both primary care physician and neurologist. She had an additional fall  today. Her gait abnormality shows a wide based gait with frequent hesitancy. She requires minimal assistance. Does not seem significantly worse from prior but persistent.   Spoke with Dr. Amada Jupiter with Neurology who will see the patient tonight. Recommends 500 mg IV methylprednisone BID and hospitalist admission. Discussed MRI findings and diagnosis along with plan for admission with the patient in detail.   Discussed patient's case with hospitalist, Dr. Maryfrances Bunnell. Patient and family (if present) updated with plan. Care transferred to hospitalist service.  I reviewed all nursing notes, vitals, pertinent old records, EKGs, labs, imaging (as available).   _________________________________  FINAL CLINICAL IMPRESSION(S) / ED DIAGNOSES  Final diagnoses:  MS (multiple sclerosis) (HCC)     MEDICATIONS GIVEN DURING THIS VISIT:  Medications  methylPREDNISolone sodium succinate (SOLU-MEDROL) 500 mg in sodium chloride 0.9 % 50 mL IVPB (500 mg Intravenous Given 10/25/16 0920)  cephALEXin (KEFLEX) capsule 1,000 mg (1,000 mg Oral Given 10/25/16 0919)  enoxaparin (LOVENOX) injection 40 mg (40 mg Subcutaneous Given 10/25/16 0920)  acetaminophen (TYLENOL) tablet 650 mg (not administered)    Or  acetaminophen (TYLENOL) suppository 650 mg (not administered)  ibuprofen (ADVIL,MOTRIN) tablet 400 mg (not administered)  amLODipine (NORVASC) tablet 5 mg (5 mg Oral Given 10/25/16 0051)     NEW OUTPATIENT MEDICATIONS STARTED DURING THIS VISIT:  None   Note:  This document was prepared using Dragon voice recognition software and may include unintentional dictation errors.  Alona Bene, MD Emergency Medicine   Maia Plan, MD 10/25/16 [phone]

## 2016-10-24 NOTE — ED Notes (Addendum)
Pt ambulated self efficiently with O2 sat beginning at 92%. While ambulating pt O2 sat dropped to 90%. When ambulated back to bedside O2 sat was at 84% and climbed to 88%. Pt was placed back on O2 @ 2L.

## 2016-10-24 NOTE — H&P (Signed)
History and Physical  Patient Name: Brittany Hartman     ZOX:[id number]    DOB: [birth year]    DOA: 10/24/2016 PCP: No PCP Per Patient   Patient coming from: Home  Chief Complaint: Leg stiffness  HPI: Brittany Hartman is a 51 y.o. female with a past medical history significant for hypertension untreated who presents with 6 months leg stiffness.  The patient has had 6 months of intermittent leg "stiffness" and abnormalities of gait.  In the last month, she moved to [facility], and this problem has worsened.  One week ago, she was seen in the ER for "soreness" and "balance issues" and "walking funny", diagnosed with UTI and started on cephalexin.  Today, she was walking down some stairs when she had her legs give out and fell, iwthout LOC, palpitations, chest discomfort or dyspnea.    ED course: -Afebrile, heart rate 54, respirationsa nd pulse ox normal, BP 184/109 -Na 144, K 3.5, Cr 1.01, WBC 6.4K, Hgb 14.1 -Alcohol level negative -Urinalysis showed persistent hematuria -MR brain was obtained which showed new lesions consistent with multiple sclerosis -The case was discussed with neurology who recommended admission, high-dose IV steroids     ROS: Review of Systems  Neurological: Positive for dizziness and focal weakness.       Abnormal taste on left tongue last week  All other systems reviewed and are negative.         Past Medical History:  Diagnosis Date  . Hypertension     History reviewed. No pertinent surgical history.  Social History: Patient lives with her cousin right now.  She is from [place] area, moved down here in March.  Was a Interior and spatial designer, currently unemployed.  The patient walks unassisted.  Does not smoke or use alcohol or illicit drugs.    No Known Allergies  Family history: family history includes Heart attack in her mother; Hypertension in her brother.  Prior to Admission medications   Medication Sig Start Date End Date Taking? Authorizing Provider    cephALEXin (KEFLEX) 500 MG capsule 2 caps po bid x 7 days Patient taking differently: Take 1,000 mg by mouth 2 (two) times daily. 2 caps po bid x 7 days 10/18/16  Yes Marily Memos, MD       Physical Exam: BP (!) 187/96   Pulse (!) 53   Temp 98 F (36.7 C)   Resp 18   Ht 5\' 8"  (1.727 m)   Wt 102.1 kg (225 lb)   SpO2 98%   BMI 34.21 kg/m  General appearance: Well-developed, adult female, alert and in no acut distress.   Eyes: Anicteric, conjunctiva pink, lids and lashes normal. PERRL.    ENT: No nasal deformity, discharge, epistaxis.  Hearing normal. OP moist without lesions.   Neck: No neck masses.  Trachea midline.  No thyromegaly/tenderness. Lymph: No cervical or supraclavicular lymphadenopathy. Skin: Warm and dry. No suspicious rashes or lesions. Cardiac: RRR, nl S1-S2, no murmurs appreciated.  Capillary refill is brisk.  JVP not visible.  No LE edema.  Radial pulses 2+ and symmetric. Respiratory: Normal respiratory rate and rhythm.  CTAB without rales or wheezes. Abdomen: Abdomen soft.  No TTP. No ascites, distension, hepatosplenomegaly.   MSK: No deformities or effusions.  No cyanosis or clubbing. Neuro: Cranial nerves normal.  Sensation intact to light touch. Speech is fluent.  Muscle strength seems slightly less in left hip flexion, otherwise seems symmetic and 5/5.    Psych: Sensorium intact and responding to questions, attention normal.  Behavior appropriate.  Affect normal.  Judgment and insight appear normal.     Labs on Admission:  I have personally reviewed following labs and imaging studies: CBC:  Recent Labs Lab 10/18/16 2150 10/24/16 1928  WBC 5.8 6.4  NEUTROABS 3.4 3.6  HGB 13.2 14.1  HCT 40.4 43.2  MCV 92.7 92.3  PLT 220 204   Basic Metabolic Panel:  Recent Labs Lab 10/18/16 2150 10/24/16 1928  NA 141 144  K 3.6 3.5  CL 108 105  CO2 25 26  GLUCOSE 76 92  BUN 11 13  CREATININE 0.96 1.01*  CALCIUM 8.9 9.7   GFR: Estimated Creatinine  Clearance: 82.4 mL/min (A) (by C-G formula based on SCr of 1.01 mg/dL (H)).  Liver Function Tests:  Recent Labs Lab 10/18/16 2150 10/24/16 1928  AST 23 21  ALT 21 28  ALKPHOS 86 93  BILITOT 0.5 0.7  PROT 7.2 7.9  ALBUMIN 3.5 4.0   No results for input(s): LIPASE, AMYLASE in the last 168 hours. No results for input(s): AMMONIA in the last 168 hours. Coagulation Profile: No results for input(s): INR, PROTIME in the last 168 hours. Cardiac Enzymes:  Recent Labs Lab 10/18/16 2150  CKTOTAL 291*   BNP (last 3 results) No results for input(s): PROBNP in the last 8760 hours. HbA1C: No results for input(s): HGBA1C in the last 72 hours. CBG: No results for input(s): GLUCAP in the last 168 hours. Lipid Profile: No results for input(s): CHOL, HDL, LDLCALC, TRIG, CHOLHDL, LDLDIRECT in the last 72 hours. Thyroid Function Tests: No results for input(s): TSH, T4TOTAL, FREET4, T3FREE, THYROIDAB in the last 72 hours. Anemia Panel: No results for input(s): VITAMINB12, FOLATE, FERRITIN, TIBC, IRON, RETICCTPCT in the last 72 hours. Sepsis Labs: Invalid input(s): PROCALCITONIN, LACTICIDVEN Recent Results (from the past 240 hour(s))  Urine culture     Status: Abnormal   Collection Time: 10/18/16 10:41 PM  Result Value Ref Range Status   Specimen Description URINE, CLEAN CATCH  Final   Special Requests NONE  Final   Culture MULTIPLE SPECIES PRESENT, SUGGEST RECOLLECTION (A)  Final   Report Status 10/20/2016 FINAL  Final         Radiological Exams on Admission: Personally reviewed MR brain report: Mr Laqueta Jean Wo Contrast  Result Date: 10/24/2016 CLINICAL DATA:  Leg stiffness, legs gave out today. Similar symptoms intermittently for 6 months. Evaluate gait abnormality. History of hypertension. EXAM: MRI HEAD WITHOUT CONTRAST TECHNIQUE: Multiplanar, multiecho pulse sequences of the brain and surrounding structures were obtained without intravenous contrast. COMPARISON:  CT HEAD October 18, 2016 FINDINGS: BRAIN: At least 6 infratentorial white matter lesions (including brainstem) infarct greater than 10 supratentorial white matter lesions which predominately radiate from the periventricular margin with low T1 signal compatible with black holes of demyelination. Multifocal T2 shine through and scattered areas of equivocal, possibly hyperacute demyelination. Faint enhancement within the LEFT superior cerebellar peduncle, RIGHT periventricular frontal lobe, RIGHT frontal lobe, rostrum of the corpus callosum and to lesser extent along the periphery of LEFT periatrial white matter lesion. 7 mm LEFT basal ganglia lesion. LEFT parietal small cortical lesion. Dominant 11 mm LEFT frontal lobe lesion. Mild parenchymal brain volume loss for age. A few scattered chronic micro hemorrhages. No abnormal extra-axial fluid collections cord enhancement. VASCULAR: Normal major intracranial vascular flow voids present at skull base. SKULL AND UPPER CERVICAL SPINE: No abnormal sellar expansion. No suspicious calvarial bone marrow signal. Craniocervical junction maintained. SINUSES/ORBITS: The mastoid air-cells and included paranasal sinuses  are well-aerated. The included ocular globes and orbital contents are non-suspicious. OTHER: None. IMPRESSION: Severe chronic supra- and infratentorial demyelination with superimposed hyperacute and acute demyelinating lesions including LEFT superior cerebellar peduncle. Mild parenchymal brain volume loss for age. Electronically Signed   By: Awilda Metro M.D.   On: 10/24/2016 21:32    EKG: Independently reviewed. Rate 57, QTc 412, normal sinus rhythm.    Assessment/Plan  1. Multiple sclerosis:  New onset. -Solu-medrol 500 mg BID for three days, then per Neuro -Neurology, cosultation appreciated   2. Hypertension:  Previously diagnosed, thinks she was on amlodipine.  Not currently treated. -Restart amlodipine  3. Urinary tract infection:  Resolving.   -Recollect  urine which had multiple species before. -Continue last 3 doses of cephalexin             DVT prophylaxis: Lovenox  Code Status: FULL  Family Communication: None present  Disposition Plan: Anticipate IV solumedrol and start BP meds and further disposition per Nerology. Consults called: Neurology Admission status: INPATIENT          Medical decision making: Patient seen at 10:45 PM on 10/24/2016.  The patient was discussed with Dr. Gladis Riffle.  What exists of the patient's chart was reviewed in depth and summarized above.  Clinical condition: stable.        Alberteen Sam [facility] Pager [phone]

## 2016-10-24 NOTE — ED Notes (Signed)
Admitting doctor  And dr Amada Jupiter in to see

## 2016-10-24 NOTE — Consult Note (Signed)
Neurology Consultation Reason for Consult: gait change Referring Physician: Mariel Aloe  CC: Worsening gait dysfunction  History is obtained from: Patient  HPI: Tyasia Rippetoe is a 51 y.o. female with a history of progressive gait dysfunction for the past 6 years. She first noticed it about 6 years ago, and is gotten slightly worse intervening time. Over the past few days, however, it has gotten markedly worse and she has noticed worsening stiffness of her legs.  For this reason she presented to the emergency department where a MRI of the brain was obtained which demonstrates findings typical of multiple sclerosis   ROS: A 14 point ROS was performed and is negative except as noted in the HPI.   Past Medical History:  Diagnosis Date  . Hypertension      Family history: Cousin-fibromyalgia   Social History:  reports that she has never smoked. She has never used smokeless tobacco. She reports that she does not drink alcohol. Her drug history is not on file.   Exam: Current vital signs: BP (!) 187/96   Pulse (!) 53   Temp 98 F (36.7 C)   Resp 18   Ht 5\' 8"  (1.727 m)   Wt 102.1 kg (225 lb)   SpO2 98%   BMI 34.21 kg/m  Vital signs in last 24 hours: Temp:  [97.9 F (36.6 C)-98 F (36.7 C)] 98 F (36.7 C) (04/11 2238) Pulse Rate:  [53-54] 53 (04/11 2238) Resp:  [18] 18 (04/11 2238) BP: (184-187)/(96-109) 187/96 (04/11 2238) SpO2:  [98 %] 98 % (04/11 1611) Weight:  [102.1 kg (225 lb)] 102.1 kg (225 lb) (04/11 1610)   Physical Exam  Constitutional: Appears well-developed and well-nourished.  Psych: Affect appropriate to situation Eyes: No scleral injection HENT: No OP obstrucion Head: Normocephalic.  Cardiovascular: Normal rate and regular rhythm.  Respiratory: Effort normal and breath sounds normal to anterior ascultation GI: Soft.  No distension. There is no tenderness.  Skin: WDI  Neuro: Mental Status: Patient is awake, alert, oriented to person, place, month,  year, and situation. Patient is able to give a clear and coherent history. No signs of aphasia or neglect Cranial Nerves: II: Visual Fields are full. Pupils are equal, round, and reactive to light.   III,IV, VI: EOMI without ptosis or diploplia.  V: Facial sensation is symmetric to temperature VII: Facial movement is symmetric.  VIII: hearing is intact to voice X: Uvula elevates symmetrically XI: Shoulder shrug is symmetric. XII: tongue is midline without atrophy or fasciculations.  Motor: Tone is normal. Bulk is normal. 5/5 strength was present in bilateral arms, she has give way weakness of bilateral legs Sensory: Sensation is symmetric to light touch and temperature in the arms and legs. Deep Tendon Reflexes: 2+ and symmetric in the patellae. No clonus Plantars: Toes are downgoing bilaterally.  Cerebellar: She has significant difficulty with finger-nose-finger bilaterally, with some past pointing, slightly worse on the right than the left. she also has difficulty with heel-knee-shin  I have reviewed labs in epic and the results pertinent to this consultation are: CMP-unremarkable  I have reviewed the images obtained: MRI brain-T2 lesions in at least 3 of the 4 locations specified by McDonald criteria(juxtacortical, periventricular, infratentorial). Spinal cord was not imaged.  Impression: 51 year old female with what is almost certainly multiple sclerosis with acute flare. She will need to be admitted for IV steroids. Though I think mimics are unlikely, given the characteristic nature of her MRI, we'll send ANA, SSA+B, HIV.  Recommendations: 1) ANA,  SSA+B, HIV 2) IV Solu-Medrol 500 mg twice a day for 3-5 days 3) if stiffness continues to be a problem after treatment, could consider adding baclofen 2.5 mg 3 times a day 4) MRI cervical and thoracic spine to obtain a baseline. 5) neurology will continue to follow   Ritta Slot, MD [facility] [phone]  If 7pm- 7am, please page neurology on call as listed in AMION.

## 2016-10-24 NOTE — ED Notes (Signed)
Phlebotomy called to draw labs.

## 2016-10-24 NOTE — ED Notes (Signed)
Pt was given meal bag and sprite.

## 2016-10-24 NOTE — ED Notes (Signed)
Pt can eat/ drink per admitting MD.

## 2016-10-24 NOTE — ED Notes (Signed)
Pt sitting in chair waiting for the edp to tell her about her mri  Wants food to eat   Not yet  Need an order

## 2016-10-25 LAB — HIV ANTIBODY (ROUTINE TESTING W REFLEX)
HIV SCREEN 4TH GENERATION: NONREACTIVE
HIV SCREEN 4TH GENERATION: NONREACTIVE

## 2016-10-25 LAB — CBC
HCT: 41.4 % (ref 36.0–46.0)
HEMOGLOBIN: 13.4 g/dL (ref 12.0–15.0)
MCH: 29.8 pg (ref 26.0–34.0)
MCHC: 32.4 g/dL (ref 30.0–36.0)
MCV: 92 fL (ref 78.0–100.0)
Platelets: 222 10*3/uL (ref 150–400)
RBC: 4.5 MIL/uL (ref 3.87–5.11)
RDW: 14.6 % (ref 11.5–15.5)
WBC: 6.4 10*3/uL (ref 4.0–10.5)

## 2016-10-25 LAB — GLUCOSE, CAPILLARY
GLUCOSE-CAPILLARY: 163 mg/dL — AB (ref 65–99)
Glucose-Capillary: 238 mg/dL — ABNORMAL HIGH (ref 65–99)

## 2016-10-25 MED ORDER — CEPHALEXIN 500 MG PO CAPS
1000.0000 mg | ORAL_CAPSULE | Freq: Two times a day (BID) | ORAL | Status: AC
  Administered 2016-10-25 (×3): 1000 mg via ORAL
  Filled 2016-10-25: qty 4
  Filled 2016-10-25 (×2): qty 2

## 2016-10-25 MED ORDER — IBUPROFEN 400 MG PO TABS: 400.0000 mg | ORAL_TABLET | Freq: Four times a day (QID) | ORAL | Status: DC | PRN

## 2016-10-25 MED ORDER — ACETAMINOPHEN 650 MG RE SUPP: 650.0000 mg | Freq: Four times a day (QID) | RECTAL | Status: DC | PRN

## 2016-10-25 MED ORDER — ACETAMINOPHEN 325 MG PO TABS: 650.0000 mg | ORAL_TABLET | Freq: Four times a day (QID) | ORAL | Status: DC | PRN

## 2016-10-25 MED ORDER — HYDRALAZINE HCL 20 MG/ML IJ SOLN
10.0000 mg | INTRAMUSCULAR | Status: DC | PRN
  Administered 2016-10-25 – 2016-10-26 (×2): 10 mg via INTRAVENOUS
  Filled 2016-10-25 (×3): qty 1

## 2016-10-25 MED ORDER — HYDRALAZINE HCL 10 MG PO TABS
10.0000 mg | ORAL_TABLET | Freq: Three times a day (TID) | ORAL | Status: DC
  Administered 2016-10-25 (×2): 10 mg via ORAL
  Filled 2016-10-25 (×2): qty 1

## 2016-10-25 MED ORDER — ENOXAPARIN SODIUM 40 MG/0.4ML ~~LOC~~ SOLN
40.0000 mg | SUBCUTANEOUS | Status: DC
  Administered 2016-10-25 – 2016-10-29 (×5): 40 mg via SUBCUTANEOUS
  Filled 2016-10-25 (×5): qty 0.4

## 2016-10-25 MED ORDER — AMLODIPINE BESYLATE 5 MG PO TABS
5.0000 mg | ORAL_TABLET | Freq: Every day | ORAL | Status: DC
  Administered 2016-10-25: 5 mg via ORAL
  Filled 2016-10-25 (×2): qty 1

## 2016-10-25 MED ORDER — AMLODIPINE BESYLATE 10 MG PO TABS
10.0000 mg | ORAL_TABLET | Freq: Every day | ORAL | Status: DC
  Administered 2016-10-26 – 2016-10-29 (×4): 10 mg via ORAL
  Filled 2016-10-25 (×4): qty 1

## 2016-10-25 NOTE — Progress Notes (Signed)
[facility] PROGRESS NOTE  Brittany Hartman OZD:[id number] DOB: 1966-03-08 DOA: 10/24/2016   PCP: No PCP Per Patient     Assessment/Plan: Principal Problem:   Multiple sclerosis (HCC) Active Problems:   Essential hypertension   Acute cystitis with hematuria    Brittany Hartman is a 51 y.o. female with a past medical history significant for hypertension untreated who presents with 6 months leg stiffness.  The patient has had 6 months of intermittent leg "stiffness" and abnormalities of gait.  In the last month, she moved to [facility], and this problem has worsened.  One week ago, she was seen in the ER for "soreness" and "balance issues" and "walking funny", diagnosed with UTI and started on cephalexin.  Today, she was walking down some stairs when she had her legs give out and fell, iwthout LOC. MRI concerning for multiple sclerosis  Assessment and plan  1. Multiple sclerosis:  IV Solu-Medrol 500 mg twice a day for 3-5 days  Neurology, cosultation appreciated MRI of the spine pending Neurology will continue to follow  2. Hypertension:  Continue when necessary hydralazine/Norvasc  3. Urinary tract infection:  Previous urine culture shows multiple species   Recollect urine which had multiple species before. Continue last 3 doses of cephalexin    DVT prophylaxsis  lovenox   Code Status:  Full code     Family Communication: Discussed in detail with the patient, all imaging results, lab results explained to the patient   Disposition Plan:  Complete iv steroids ,PT eval     Consultants:  neurology  Procedures:  NONE   Antibiotics: Anti-infectives    Start     Dose/Rate Route Frequency Ordered Stop   10/25/16 0115  cephALEXin (KEFLEX) capsule 1,000 mg    Comments:  2 caps po bid x 7 days     1,000 mg Oral 2 times daily with meals 10/25/16 0109 10/26/16 0759         HPI/Subjective: Noted to be hypertensive,came in with numbness in both legs    Objective: Vitals:   10/25/16 0200 10/25/16 0238 10/25/16 0243 10/25/16 0508  BP: (!) 177/100  (!) 191/101 (!) 158/84  Pulse: (!) 58  63 65  Resp:   17 16  Temp:   97.8 F (36.6 C) 97.8 F (36.6 C)  TempSrc:   Oral Oral  SpO2: 97%  100% 97%  Weight:  120.8 kg (266 lb 4.8 oz)    Height:  5\' 6"  (1.676 m)      Intake/Output Summary (Last 24 hours) at 10/25/16 0958 Last data filed at 10/25/16 0534  Gross per 24 hour  Intake                0 ml  Output              800 ml  Net             -800 ml    Exam:  Examination:  General exam: Appears calm and comfortable  Respiratory system: Clear to auscultation. Respiratory effort normal. Cardiovascular system: S1 & S2 heard, RRR. No JVD, murmurs, rubs, gallops or clicks. No pedal edema. Gastrointestinal system: Abdomen is nondistended, soft and nontender. No organomegaly or masses felt. Normal bowel sounds heard. Central nervous system: Alert and oriented. No focal neurological deficits. Extremities: Symmetric 5 x 5 power. Skin: No rashes, lesions or ulcers Psychiatry: Judgement and insight appear normal. Mood & affect appropriate.     Data Reviewed: I  have personally reviewed following labs and imaging studies  Micro Results Recent Results (from the past 240 hour(s))  Urine culture     Status: Abnormal   Collection Time: 10/18/16 10:41 PM  Result Value Ref Range Status   Specimen Description URINE, CLEAN CATCH  Final   Special Requests NONE  Final   Culture MULTIPLE SPECIES PRESENT, SUGGEST RECOLLECTION (A)  Final   Report Status 10/20/2016 FINAL  Final    Radiology Reports Ct Head Wo Contrast  Result Date: 10/18/2016 CLINICAL DATA:  Lightheadedness and vertigo. EXAM: CT HEAD WITHOUT CONTRAST TECHNIQUE: Contiguous axial images were obtained from the base of the skull through the vertex without intravenous contrast. COMPARISON:  None. FINDINGS: Brain: There is no intracranial hemorrhage, mass or evidence of acute  infarction. There is mild generalized atrophy. There is mild chronic microvascular ischemic change. There is no significant extra-axial fluid collection. Benign hyperostosis calcifications incidentally noted. No acute intracranial findings are evident. Vascular: No hyperdense vessel or unexpected calcification. Skull: Normal. Negative for fracture or focal lesion. Sinuses/Orbits: No acute finding. Other: None. IMPRESSION: No acute intracranial findings. There is mild generalized atrophy and chronic appearing white matter hypodensities which likely represent small vessel ischemic disease. Electronically Signed   By: Ellery Plunk M.D.   On: 10/18/2016 23:50   Mr Laqueta Jean NW Contrast  Result Date: 10/24/2016 CLINICAL DATA:  Leg stiffness, legs gave out today. Similar symptoms intermittently for 6 months. Evaluate gait abnormality. History of hypertension. EXAM: MRI HEAD WITHOUT CONTRAST TECHNIQUE: Multiplanar, multiecho pulse sequences of the brain and surrounding structures were obtained without intravenous contrast. COMPARISON:  CT HEAD October 18, 2016 FINDINGS: BRAIN: At least 6 infratentorial white matter lesions (including brainstem) infarct greater than 10 supratentorial white matter lesions which predominately radiate from the periventricular margin with low T1 signal compatible with black holes of demyelination. Multifocal T2 shine through and scattered areas of equivocal, possibly hyperacute demyelination. Faint enhancement within the LEFT superior cerebellar peduncle, RIGHT periventricular frontal lobe, RIGHT frontal lobe, rostrum of the corpus callosum and to lesser extent along the periphery of LEFT periatrial white matter lesion. 7 mm LEFT basal ganglia lesion. LEFT parietal small cortical lesion. Dominant 11 mm LEFT frontal lobe lesion. Mild parenchymal brain volume loss for age. A few scattered chronic micro hemorrhages. No abnormal extra-axial fluid collections cord enhancement. VASCULAR: Normal  major intracranial vascular flow voids present at skull base. SKULL AND UPPER CERVICAL SPINE: No abnormal sellar expansion. No suspicious calvarial bone marrow signal. Craniocervical junction maintained. SINUSES/ORBITS: The mastoid air-cells and included paranasal sinuses are well-aerated. The included ocular globes and orbital contents are non-suspicious. OTHER: None. IMPRESSION: Severe chronic supra- and infratentorial demyelination with superimposed hyperacute and acute demyelinating lesions including LEFT superior cerebellar peduncle. Mild parenchymal brain volume loss for age. Electronically Signed   By: Awilda Metro M.D.   On: 10/24/2016 21:32     CBC  Recent Labs Lab 10/18/16 2150 10/24/16 1928 10/25/16 0524  WBC 5.8 6.4 6.4  HGB 13.2 14.1 13.4  HCT 40.4 43.2 41.4  PLT 220 204 222  MCV 92.7 92.3 92.0  MCH 30.3 30.1 29.8  MCHC 32.7 32.6 32.4  RDW 14.7 14.3 14.6  LYMPHSABS 1.9 2.3  --   MONOABS 0.3 0.3  --   EOSABS 0.2 0.2  --   BASOSABS 0.0 0.0  --     Chemistries   Recent Labs Lab 10/18/16 2150 10/24/16 1928  NA 141 144  K 3.6 3.5  CL 108 105  CO2 25 26  GLUCOSE 76 92  BUN 11 13  CREATININE 0.96 1.01*  CALCIUM 8.9 9.7  AST 23 21  ALT 21 28  ALKPHOS 86 93  BILITOT 0.5 0.7   ------------------------------------------------------------------------------------------------------------------ estimated creatinine clearance is 87.3 mL/min (A) (by C-G formula based on SCr of 1.01 mg/dL (H)). ------------------------------------------------------------------------------------------------------------------ No results for input(s): HGBA1C in the last 72 hours. ------------------------------------------------------------------------------------------------------------------ No results for input(s): CHOL, HDL, LDLCALC, TRIG, CHOLHDL, LDLDIRECT in the last 72  hours. ------------------------------------------------------------------------------------------------------------------ No results for input(s): TSH, T4TOTAL, T3FREE, THYROIDAB in the last 72 hours.  Invalid input(s): FREET3 ------------------------------------------------------------------------------------------------------------------ No results for input(s): VITAMINB12, FOLATE, FERRITIN, TIBC, IRON, RETICCTPCT in the last 72 hours.  Coagulation profile No results for input(s): INR, PROTIME in the last 168 hours.  No results for input(s): DDIMER in the last 72 hours.  Cardiac Enzymes No results for input(s): CKMB, TROPONINI, MYOGLOBIN in the last 168 hours.  Invalid input(s): CK ------------------------------------------------------------------------------------------------------------------ Invalid input(s): POCBNP   CBG: No results for input(s): GLUCAP in the last 168 hours.     Studies: Mr Laqueta Jean Wo Contrast  Result Date: 10/24/2016 CLINICAL DATA:  Leg stiffness, legs gave out today. Similar symptoms intermittently for 6 months. Evaluate gait abnormality. History of hypertension. EXAM: MRI HEAD WITHOUT CONTRAST TECHNIQUE: Multiplanar, multiecho pulse sequences of the brain and surrounding structures were obtained without intravenous contrast. COMPARISON:  CT HEAD October 18, 2016 FINDINGS: BRAIN: At least 6 infratentorial white matter lesions (including brainstem) infarct greater than 10 supratentorial white matter lesions which predominately radiate from the periventricular margin with low T1 signal compatible with black holes of demyelination. Multifocal T2 shine through and scattered areas of equivocal, possibly hyperacute demyelination. Faint enhancement within the LEFT superior cerebellar peduncle, RIGHT periventricular frontal lobe, RIGHT frontal lobe, rostrum of the corpus callosum and to lesser extent along the periphery of LEFT periatrial white matter lesion. 7 mm LEFT  basal ganglia lesion. LEFT parietal small cortical lesion. Dominant 11 mm LEFT frontal lobe lesion. Mild parenchymal brain volume loss for age. A few scattered chronic micro hemorrhages. No abnormal extra-axial fluid collections cord enhancement. VASCULAR: Normal major intracranial vascular flow voids present at skull base. SKULL AND UPPER CERVICAL SPINE: No abnormal sellar expansion. No suspicious calvarial bone marrow signal. Craniocervical junction maintained. SINUSES/ORBITS: The mastoid air-cells and included paranasal sinuses are well-aerated. The included ocular globes and orbital contents are non-suspicious. OTHER: None. IMPRESSION: Severe chronic supra- and infratentorial demyelination with superimposed hyperacute and acute demyelinating lesions including LEFT superior cerebellar peduncle. Mild parenchymal brain volume loss for age. Electronically Signed   By: Awilda Metro M.D.   On: 10/24/2016 21:32      No results found for: HGBA1C Lab Results  Component Value Date   CREATININE 1.01 (H) 10/24/2016       Scheduled Meds: . [START ON 10/26/2016] amLODipine  10 mg Oral Daily  . cephALEXin  1,000 mg Oral BID WC  . enoxaparin (LOVENOX) injection  40 mg Subcutaneous Q24H  . methylPREDNISolone (SOLU-MEDROL) injection  500 mg Intravenous Q12H   Continuous Infusions:   LOS: 1 day    Time spent: >30 MINS    Richarda Overlie  [facility] Pager [phone]. If 7PM-7AM, please contact night-coverage at [email or web address], password [facility] 10/25/2016, 9:58 AM  LOS: 1 day

## 2016-10-25 NOTE — ED Notes (Signed)
Admitting doctor replied he had ordered norvasc 47m g tonight for the high bp  Given po rapid response nurse sent here from 5 w to check appropriateness  For this pt on the floor

## 2016-10-25 NOTE — Progress Notes (Signed)
Rehab Admissions Coordinator Note:  Patient was screened by Clois Dupes for appropriateness for an Inpatient Acute Rehab Consult per PT recommendations.  At this time, we are recommending Inpatient Rehab consult and OT eval.  Clois Dupes 10/25/2016, 5:51 PM  I can be reached at [phone].

## 2016-10-25 NOTE — Progress Notes (Signed)
Patient arrived to the unit via bed from the emergency room.  Patient is ambulatory but unsteady.  One assist is required with walker.  Patient is alert and oriented x 4.  No complaints of pain.  Vital signs: BP 191/101 ; Pulse: 63; resp: 17 and 100% on room air. MD is aware of the patient high blood pressure readings and AC Shelly stated prior to the pt arrival that nothing more will be administered tonight for blood pressure due to patient not taking her blood pressure meds for 2 yrs now.  Skin assessment complete.  Scratch noted under patient right breast. Bruise noted on the right hip and sacrum area.  IV noted to patient right forearm which is clean, dry and intact. Saline locked. Educated the patient on how to reach the staff on the unit and the importance of notifing the staff prior to getting up due to safety concerns.  Activated the bed alarm, lowered the bed, and placed the call light within reach.  Will continue to monitor the patient

## 2016-10-25 NOTE — Progress Notes (Signed)
Patients manual bp is 182/98, too soon for hydralazine, abrol advised via text page.

## 2016-10-25 NOTE — ED Notes (Signed)
Solu-medrol infused

## 2016-10-25 NOTE — Evaluation (Signed)
Physical Therapy Evaluation Patient Details Name: Brittany Hartman MRN: [id number] DOB: August 26, 1965 Today's Date: 10/25/2016   History of Present Illness  Flormaria Moilanen is a 51 y.o. female with a past medical history significant for hypertension untreated who presents with 6 months leg stiffness.  Recent tx for UTI.  MR brain was obtained which showed new lesions consistent with multiple sclerosis.  Clinical Impression  Patient presents with decreased safety and independence with mobility due to LE weakness, ataxia, poor safety awareness with impulsivity, and very high risk for falls.  Currently requires mod to max A for mobility and was previously independent.  She will benefit from skilled PT in the acute setting to allow improved safety and independence.  She will benefit from CIR level rehab prior to d/c home.     Follow Up Recommendations CIR    Equipment Recommendations  Rolling walker with 5" wheels    Recommendations for Other Services       Precautions / Restrictions Precautions Precautions: Fall      Mobility  Bed Mobility Overal bed mobility: Needs Assistance Bed Mobility: Sit to Supine       Sit to supine: Max assist   General bed mobility comments: assist for legs into bed, then to adjust trunk, but pt able to pull herself up using headboard  Transfers Overall transfer level: Needs assistance Equipment used: None Transfers: Sit to/from Stand Sit to Stand: Max assist         General transfer comment: in room on EOB with nursing when I entered and pt standing up unaided, I supported her once up due to severe LOB; sat with uncontrolled descent on EOB near edge, assist to stand and reposition for safety   Assist down to toilet again with cues and assist due to uncontrolled descent  Ambulation/Gait Ambulation/Gait assistance: Mod assist;Max assist Ambulation Distance (Feet): 12 Feet (x 2) Assistive device: Rolling walker (2 wheeled) Gait Pattern/deviations:  Step-to pattern;Decreased stride length;Decreased weight shift to left;Wide base of support;Ataxic;Shuffle     General Gait Details: heavy lean to R, assist to unweight R to allow swing, pt reported R LE giving out on her once close to bed; assist for walker management due to tilting walker to R with R lateral lean; also trouble accessing bathroom safely due to needing to side step and max cues and assist for safety  Stairs            Wheelchair Mobility    Modified Rankin (Stroke Patients Only)       Balance Overall balance assessment: Needs assistance   Sitting balance-Leahy Scale: Fair Sitting balance - Comments: seated EOB leans back due to on edge     Standing balance-Leahy Scale: Poor Standing balance comment: washing hands leans forward on counter and has to catch herself several times from falling forward with assist                             Pertinent Vitals/Pain Pain Assessment: No/denies pain    Home Living Family/patient expects to be discharged to:: Private residence Living Arrangements: Other relatives (cousin) Available Help at Discharge: Family;Available PRN/intermittently Type of Home: Other(Comment) (townhouse) Home Access: Stairs to enter   [employer] of Steps: 1 Home Layout: Two level;Bed/bath upstairs Home Equipment: None      Prior Function Level of Independence: Independent               Hand Dominance  Extremity/Trunk Assessment   Upper Extremity Assessment Upper Extremity Assessment: Generalized weakness    Lower Extremity Assessment Lower Extremity Assessment: Generalized weakness       Communication   Communication: No difficulties  Cognition Arousal/Alertness: Awake/alert Behavior During Therapy: Impulsive Overall Cognitive Status: No family/caregiver present to determine baseline cognitive functioning                                        General Comments       Exercises     Assessment/Plan    PT Assessment Patient needs continued PT services  PT Problem List Decreased strength;Decreased balance;Decreased knowledge of use of DME;Decreased safety awareness;Decreased mobility;Decreased activity tolerance;Decreased coordination       PT Treatment Interventions DME instruction;Gait training;Functional mobility training;Stair training;Balance training;Therapeutic exercise;Patient/family education;Therapeutic activities    PT Goals (Current goals can be found in the Care Plan section)  Acute Rehab PT Goals Patient Stated Goal: To go to rehab PT Goal Formulation: With patient Time For Goal Achievement: 11/01/16 Potential to Achieve Goals: Good    Frequency Min 4X/week   Barriers to discharge        Co-evaluation               End of Session Equipment Utilized During Treatment: Gait belt Activity Tolerance: Patient limited by fatigue Patient left: in bed;with call bell/phone within reach;with bed alarm set Nurse Communication: Mobility status PT Visit Diagnosis: Other abnormalities of gait and mobility (R26.89);Ataxic gait (R26.0);History of falling (Z91.81)    Time: 6045-4098 PT Time Calculation (min) (ACUTE ONLY): 15 min   Charges:   PT Evaluation $PT Eval High Complexity: 1 Procedure     PT G CodesSheran Lawless, PT [phone] 10/25/2016   Elray Mcgregor 10/25/2016, 4:59 PM

## 2016-10-26 ENCOUNTER — Inpatient Hospital Stay (HOSPITAL_COMMUNITY): Payer: Self-pay

## 2016-10-26 LAB — BASIC METABOLIC PANEL
ANION GAP: 10 (ref 5–15)
BUN: 15 mg/dL (ref 6–20)
CHLORIDE: 108 mmol/L (ref 101–111)
CO2: 23 mmol/L (ref 22–32)
CREATININE: 1.01 mg/dL — AB (ref 0.44–1.00)
Calcium: 9.2 mg/dL (ref 8.9–10.3)
GFR calc non Af Amer: >60 mL/min (ref >60)
Glucose, Bld: 160 mg/dL — ABNORMAL HIGH (ref 65–99)
POTASSIUM: 3.2 mmol/L — AB (ref 3.5–5.1)
SODIUM: 141 mmol/L (ref 135–145)

## 2016-10-26 LAB — SJOGRENS SYNDROME-A EXTRACTABLE NUCLEAR ANTIBODY

## 2016-10-26 LAB — GLUCOSE, CAPILLARY
GLUCOSE-CAPILLARY: 149 mg/dL — AB (ref 65–99)
GLUCOSE-CAPILLARY: 155 mg/dL — AB (ref 65–99)

## 2016-10-26 LAB — URINE CULTURE: Culture: NO GROWTH

## 2016-10-26 LAB — ANTINUCLEAR ANTIBODIES, IFA: ANTINUCLEAR ANTIBODIES, IFA: NEGATIVE

## 2016-10-26 LAB — SJOGRENS SYNDROME-B EXTRACTABLE NUCLEAR ANTIBODY: SSB (La) (ENA) Antibody, IgG: <0.2 AI (ref 0.0–0.9)

## 2016-10-26 LAB — HEMOGLOBIN A1C
Hgb A1c MFr Bld: 5.5 % (ref 4.8–5.6)
Mean Plasma Glucose: 111 mg/dL

## 2016-10-26 MED ORDER — GADOBENATE DIMEGLUMINE 529 MG/ML IV SOLN
20.0000 mL | Freq: Once | INTRAVENOUS | Status: AC | PRN
  Administered 2016-10-27: 20 mL via INTRAVENOUS

## 2016-10-26 MED ORDER — HYDRALAZINE HCL 50 MG PO TABS
50.0000 mg | ORAL_TABLET | Freq: Three times a day (TID) | ORAL | Status: DC
  Administered 2016-10-26 – 2016-10-29 (×11): 50 mg via ORAL
  Filled 2016-10-26 (×11): qty 1

## 2016-10-26 MED ORDER — POTASSIUM CHLORIDE CRYS ER 20 MEQ PO TBCR
40.0000 meq | EXTENDED_RELEASE_TABLET | Freq: Two times a day (BID) | ORAL | Status: AC
  Administered 2016-10-26 – 2016-10-27 (×3): 40 meq via ORAL
  Filled 2016-10-26 (×3): qty 2

## 2016-10-26 MED ORDER — HYDRALAZINE HCL 20 MG/ML IJ SOLN
10.0000 mg | Freq: Once | INTRAMUSCULAR | Status: AC
  Administered 2016-10-26: 10 mg via INTRAVENOUS
  Filled 2016-10-26: qty 1

## 2016-10-26 MED ORDER — PANTOPRAZOLE SODIUM 40 MG PO TBEC
40.0000 mg | DELAYED_RELEASE_TABLET | Freq: Every day | ORAL | Status: DC
  Administered 2016-10-26 – 2016-10-29 (×4): 40 mg via ORAL
  Filled 2016-10-26 (×4): qty 1

## 2016-10-26 NOTE — Progress Notes (Signed)
Pt returned from MRI. Pt was not able to complete scan due to moving, back pain and leg twitching. Pt stated that the IV medicine helps. Meds given see MAR.

## 2016-10-26 NOTE — Progress Notes (Signed)
[facility] PROGRESS NOTE  Brittany Hartman YYQ:[id number] DOB: [birth year] DOA: 10/24/2016   PCP: No PCP Per Patient     Assessment/Plan: Principal Problem:   Multiple sclerosis (HCC) Active Problems:   Essential hypertension   Acute cystitis with hematuria    Brittany Hartman is a 50 y.o. female with a past medical history significant for hypertension untreated who presents with 6 months leg stiffness. Now diagnosed with multiple sclerosis . MRI concerning for multiple sclerosis  Assessment and plan  1. Multiple sclerosis:  IV Solu-Medrol 500 mg twice a day but neurology  Neurology, following MRI of the thoracic/cervical spine pending  Start concomitant PPI  2. Hypertension:  uncontrolled Increased dose of hydralazine/Norvasc  3. Urinary tract infection:  Previous urine culture shows multiple species   Recollect urine which had multiple species before. Continue last 3 doses of cephalexin  4. Hypokalemia Replete    DVT prophylaxsis  lovenox   Code Status:  Full code     Family Communication: Discussed in detail with the patient, all imaging results, lab results explained to the patient   Disposition Plan:  CIR     Consultants:  neurology  Procedures:  NONE   Antibiotics: Anti-infectives    Start     Dose/Rate Route Frequency Ordered Stop   10/25/16 0115  cephALEXin (KEFLEX) capsule 1,000 mg    Comments:  2 caps po bid x 7 days     1,000 mg Oral 2 times daily with meals 10/25/16 0109 10/25/16 1558         HPI/Subjective: Her legs feel less stiff and weak  Objective: Vitals:   10/26/16 0510 10/26/16 0613 10/26/16 0617 10/26/16 0800  BP: (!) 183/87 (!) 180/101 (!) 188/103 (!) 156/81  Pulse: 80 96 94 96  Resp: 16     Temp: 98.8 F (37.1 C)     TempSrc:      SpO2: 96%     Weight:      Height:        Intake/Output Summary (Last 24 hours) at 10/26/16 0841 Last data filed at 10/26/16 0729  Gross per 24 hour  Intake               700 ml  Output             1250 ml  Net             -550 ml    Exam:  Examination:  General exam: Appears calm and comfortable  Respiratory system: Clear to auscultation. Respiratory effort normal. Cardiovascular system: S1 & S2 heard, RRR. No JVD, murmurs, rubs, gallops or clicks. No pedal edema. Gastrointestinal system: Abdomen is nondistended, soft and nontender. No organomegaly or masses felt. Normal bowel sounds heard. Central nervous system: Alert and oriented. No focal neurological deficits. Extremities: Symmetric 5 x 5 power. Skin: No rashes, lesions or ulcers Psychiatry: Judgement and insight appear normal. Mood & affect appropriate.     Data Reviewed: I have personally reviewed following labs and imaging studies  Micro Results Recent Results (from the past 240 hour(s))  Urine culture     Status: Abnormal   Collection Time: 10/18/16 10:41 PM  Result Value Ref Range Status   Specimen Description URINE, CLEAN CATCH  Final   Special Requests NONE  Final   Culture MULTIPLE SPECIES PRESENT, SUGGEST RECOLLECTION (A)  Final   Report Status 10/20/2016 FINAL  Final    Radiology Reports Ct Head Wo Contrast  Result  Date: 10/18/2016 CLINICAL DATA:  Lightheadedness and vertigo. EXAM: CT HEAD WITHOUT CONTRAST TECHNIQUE: Contiguous axial images were obtained from the base of the skull through the vertex without intravenous contrast. COMPARISON:  None. FINDINGS: Brain: There is no intracranial hemorrhage, mass or evidence of acute infarction. There is mild generalized atrophy. There is mild chronic microvascular ischemic change. There is no significant extra-axial fluid collection. Benign hyperostosis calcifications incidentally noted. No acute intracranial findings are evident. Vascular: No hyperdense vessel or unexpected calcification. Skull: Normal. Negative for fracture or focal lesion. Sinuses/Orbits: No acute finding. Other: None. IMPRESSION: No acute intracranial findings.  There is mild generalized atrophy and chronic appearing white matter hypodensities which likely represent small vessel ischemic disease. Electronically Signed   By: Ellery Plunk M.D.   On: 10/18/2016 23:50   Mr Laqueta Jean ZO Contrast  Result Date: 10/24/2016 CLINICAL DATA:  Leg stiffness, legs gave out today. Similar symptoms intermittently for 6 months. Evaluate gait abnormality. History of hypertension. EXAM: MRI HEAD WITHOUT CONTRAST TECHNIQUE: Multiplanar, multiecho pulse sequences of the brain and surrounding structures were obtained without intravenous contrast. COMPARISON:  CT HEAD October 18, 2016 FINDINGS: BRAIN: At least 6 infratentorial white matter lesions (including brainstem) infarct greater than 10 supratentorial white matter lesions which predominately radiate from the periventricular margin with low T1 signal compatible with black holes of demyelination. Multifocal T2 shine through and scattered areas of equivocal, possibly hyperacute demyelination. Faint enhancement within the LEFT superior cerebellar peduncle, RIGHT periventricular frontal lobe, RIGHT frontal lobe, rostrum of the corpus callosum and to lesser extent along the periphery of LEFT periatrial white matter lesion. 7 mm LEFT basal ganglia lesion. LEFT parietal small cortical lesion. Dominant 11 mm LEFT frontal lobe lesion. Mild parenchymal brain volume loss for age. A few scattered chronic micro hemorrhages. No abnormal extra-axial fluid collections cord enhancement. VASCULAR: Normal major intracranial vascular flow voids present at skull base. SKULL AND UPPER CERVICAL SPINE: No abnormal sellar expansion. No suspicious calvarial bone marrow signal. Craniocervical junction maintained. SINUSES/ORBITS: The mastoid air-cells and included paranasal sinuses are well-aerated. The included ocular globes and orbital contents are non-suspicious. OTHER: None. IMPRESSION: Severe chronic supra- and infratentorial demyelination with superimposed  hyperacute and acute demyelinating lesions including LEFT superior cerebellar peduncle. Mild parenchymal brain volume loss for age. Electronically Signed   By: Awilda Metro M.D.   On: 10/24/2016 21:32     CBC  Recent Labs Lab 10/24/16 1928 10/25/16 0524  WBC 6.4 6.4  HGB 14.1 13.4  HCT 43.2 41.4  PLT 204 222  MCV 92.3 92.0  MCH 30.1 29.8  MCHC 32.6 32.4  RDW 14.3 14.6  LYMPHSABS 2.3  --   MONOABS 0.3  --   EOSABS 0.2  --   BASOSABS 0.0  --     Chemistries   Recent Labs Lab 10/24/16 1928 10/26/16 0519  NA 144 141  K 3.5 3.2*  CL 105 108  CO2 26 23  GLUCOSE 92 160*  BUN 13 15  CREATININE 1.01* 1.01*  CALCIUM 9.7 9.2  AST 21  --   ALT 28  --   ALKPHOS 93  --   BILITOT 0.7  --    ------------------------------------------------------------------------------------------------------------------ estimated creatinine clearance is 87.3 mL/min (A) (by C-G formula based on SCr of 1.01 mg/dL (H)). ------------------------------------------------------------------------------------------------------------------  Recent Labs  10/25/16 1331  HGBA1C 5.5   ------------------------------------------------------------------------------------------------------------------ No results for input(s): CHOL, HDL, LDLCALC, TRIG, CHOLHDL, LDLDIRECT in the last 72 hours. ------------------------------------------------------------------------------------------------------------------ No results for input(s): TSH, T4TOTAL, T3FREE,  THYROIDAB in the last 72 hours.  Invalid input(s): FREET3 ------------------------------------------------------------------------------------------------------------------ No results for input(s): VITAMINB12, FOLATE, FERRITIN, TIBC, IRON, RETICCTPCT in the last 72 hours.  Coagulation profile No results for input(s): INR, PROTIME in the last 168 hours.  No results for input(s): DDIMER in the last 72 hours.  Cardiac Enzymes No results for input(s):  CKMB, TROPONINI, MYOGLOBIN in the last 168 hours.  Invalid input(s): CK ------------------------------------------------------------------------------------------------------------------ Invalid input(s): POCBNP   CBG:  Recent Labs Lab 10/25/16 1217 10/25/16 1712  GLUCAP 238* 163*       Studies: Mr Laqueta Jean Wo Contrast  Result Date: 10/24/2016 CLINICAL DATA:  Leg stiffness, legs gave out today. Similar symptoms intermittently for 6 months. Evaluate gait abnormality. History of hypertension. EXAM: MRI HEAD WITHOUT CONTRAST TECHNIQUE: Multiplanar, multiecho pulse sequences of the brain and surrounding structures were obtained without intravenous contrast. COMPARISON:  CT HEAD October 18, 2016 FINDINGS: BRAIN: At least 6 infratentorial white matter lesions (including brainstem) infarct greater than 10 supratentorial white matter lesions which predominately radiate from the periventricular margin with low T1 signal compatible with black holes of demyelination. Multifocal T2 shine through and scattered areas of equivocal, possibly hyperacute demyelination. Faint enhancement within the LEFT superior cerebellar peduncle, RIGHT periventricular frontal lobe, RIGHT frontal lobe, rostrum of the corpus callosum and to lesser extent along the periphery of LEFT periatrial white matter lesion. 7 mm LEFT basal ganglia lesion. LEFT parietal small cortical lesion. Dominant 11 mm LEFT frontal lobe lesion. Mild parenchymal brain volume loss for age. A few scattered chronic micro hemorrhages. No abnormal extra-axial fluid collections cord enhancement. VASCULAR: Normal major intracranial vascular flow voids present at skull base. SKULL AND UPPER CERVICAL SPINE: No abnormal sellar expansion. No suspicious calvarial bone marrow signal. Craniocervical junction maintained. SINUSES/ORBITS: The mastoid air-cells and included paranasal sinuses are well-aerated. The included ocular globes and orbital contents are  non-suspicious. OTHER: None. IMPRESSION: Severe chronic supra- and infratentorial demyelination with superimposed hyperacute and acute demyelinating lesions including LEFT superior cerebellar peduncle. Mild parenchymal brain volume loss for age. Electronically Signed   By: Awilda Metro M.D.   On: 10/24/2016 21:32      Lab Results  Component Value Date   HGBA1C 5.5 10/25/2016   Lab Results  Component Value Date   CREATININE 1.01 (H) 10/26/2016       Scheduled Meds: . amLODipine  10 mg Oral Daily  . enoxaparin (LOVENOX) injection  40 mg Subcutaneous Q24H  . hydrALAZINE  50 mg Oral TID  . methylPREDNISolone (SOLU-MEDROL) injection  500 mg Intravenous Q12H  . pantoprazole  40 mg Oral Daily  . potassium chloride  40 mEq Oral BID   Continuous Infusions:   LOS: 2 days    Time spent: >30 MINS    Richarda Overlie  [facility] Pager [phone]. If 7PM-7AM, please contact night-coverage at [email or web address], password [facility] 10/26/2016, 8:41 AM  LOS: 2 days

## 2016-10-26 NOTE — Progress Notes (Signed)
Subjective: Interval History: Her legs feel less stiff and weak.  She did walk with walker and PT.  No symptoms in the hands or arms.  She has never had optic neuritis, diplopia, vertigo, facial numbness, bowel/bladder dysfunction.  MRI Cervical and Thoracic spine pending.  Objective: Vital signs in last 24 hours: Temp:  [97.9 F (36.6 C)-98.8 F (37.1 C)] 98.8 F (37.1 C) (04/13 0510) Pulse Rate:  [80-98] 96 (04/13 0800) Resp:  [16] 16 (04/13 0510) BP: (156-188)/(81-103) 156/81 (04/13 0800) SpO2:  [96 %-100 %] 96 % (04/13 0510)  Intake/Output from previous day: 04/12 0701 - 04/13 0700 In: 700 [P.O.:700] Out: 950 [Urine:950] Intake/Output this shift: Total I/O In: -  Out: 300 [Urine:300] Nutritional status: Diet regular Room service appropriate? Yes; Fluid consistency: Thin  Neurologic Exam:  Normal except for some sensory loss in the legs and mild spasticity.    Lab Results:  Recent Labs  10/24/16 1928 10/25/16 0524 10/26/16 0519  WBC 6.4 6.4  --   HGB 14.1 13.4  --   HCT 43.2 41.4  --   PLT 204 222  --   NA 144  --  141  K 3.5  --  3.2*  CL 105  --  108  CO2 26  --  23  GLUCOSE 92  --  160*  BUN 13  --  15  CREATININE 1.01*  --  1.01*  CALCIUM 9.7  --  9.2   Lipid Panel No results for input(s): CHOL, TRIG, HDL, CHOLHDL, VLDL, LDLCALC in the last 72 hours.  Studies/Results: Mr Laqueta Jean Wo Contrast  Result Date: 10/24/2016 CLINICAL DATA:  Leg stiffness, legs gave out today. Similar symptoms intermittently for 6 months. Evaluate gait abnormality. History of hypertension. EXAM: MRI HEAD WITHOUT CONTRAST TECHNIQUE: Multiplanar, multiecho pulse sequences of the brain and surrounding structures were obtained without intravenous contrast. COMPARISON:  CT HEAD October 18, 2016 FINDINGS: BRAIN: At least 6 infratentorial white matter lesions (including brainstem) infarct greater than 10 supratentorial white matter lesions which predominately radiate from the periventricular  margin with low T1 signal compatible with black holes of demyelination. Multifocal T2 shine through and scattered areas of equivocal, possibly hyperacute demyelination. Faint enhancement within the LEFT superior cerebellar peduncle, RIGHT periventricular frontal lobe, RIGHT frontal lobe, rostrum of the corpus callosum and to lesser extent along the periphery of LEFT periatrial white matter lesion. 7 mm LEFT basal ganglia lesion. LEFT parietal small cortical lesion. Dominant 11 mm LEFT frontal lobe lesion. Mild parenchymal brain volume loss for age. A few scattered chronic micro hemorrhages. No abnormal extra-axial fluid collections cord enhancement. VASCULAR: Normal major intracranial vascular flow voids present at skull base. SKULL AND UPPER CERVICAL SPINE: No abnormal sellar expansion. No suspicious calvarial bone marrow signal. Craniocervical junction maintained. SINUSES/ORBITS: The mastoid air-cells and included paranasal sinuses are well-aerated. The included ocular globes and orbital contents are non-suspicious. OTHER: None. IMPRESSION: Severe chronic supra- and infratentorial demyelination with superimposed hyperacute and acute demyelinating lesions including LEFT superior cerebellar peduncle. Mild parenchymal brain volume loss for age. Electronically Signed   By: Awilda Metro M.D.   On: 10/24/2016 21:32    Medications:  Scheduled: . amLODipine  10 mg Oral Daily  . enoxaparin (LOVENOX) injection  40 mg Subcutaneous Q24H  . hydrALAZINE  50 mg Oral TID  . methylPREDNISolone (SOLU-MEDROL) injection  500 mg Intravenous Q12H  . pantoprazole  40 mg Oral Daily  . potassium chloride  40 mEq Oral BID    Assessment/Plan:  Newly diagnosed MS with a significant burden of demyelinating disease in the brain.  Awaiting to assess the spinal cord via imaging.  She has responded to IV solumedrol.  Continue 3000 mg total dose and then stop.  She will need to be on long term immunomodulatory treatment.  I  recommend Ocrelizumab IV 300 mg q6 months, but will defer to outpatient neurologist whom she already had an appointment for prior to hospitalization.     LOS: 2 days   Weston Settle, MD 10/26/2016  11:14 AM

## 2016-10-26 NOTE — Care Management Note (Signed)
Case Management Note  Patient Details  Name: Brittany Hartman MRN: [id number] Date of Birth: [birth year]  Subjective/Objective:        Admitted with MS, hx of HTN.            Jacqlyn Krauss      [phone]       Action/Plan:  Per PT's evaluation: CIR. CIR vs SNF when medically stable. CM to continue following for disposition needs.  Expected Discharge Date:                  Expected Discharge Plan:  Home/Self Care  In-House Referral:   CSW  Discharge planning Services  CM Consult  Post Acute Care Choice:    Choice offered to:     DME Arranged:    DME Agency:     HH Arranged:    HH Agency:     Status of Service:  In process, will continue to follow  If discussed at Long Length of Stay Meetings, dates discussed:    Additional Comments:  Epifanio Lesches, RN 10/26/2016, 4:58 PM

## 2016-10-26 NOTE — PMR Pre-admission (Signed)
PMR Admission Coordinator Pre-Admission Assessment  Patient: Brittany Hartman is an 51 y.o., female MRN: [id number] DOB: 1965-08-13 Height: 5\' 6"  (167.6 cm) Weight: 120.8 kg (266 lb 4.8 oz)              Insurance Information  PRIMARY: uninsured        Medicaid Application Date:       Case Manager:  Disability Application Date:       Case Worker:   Emergency Conservator, museum/gallery Information    Name Relation Home Work Mobile   Brittany Hartman  [phone]  [phone]     Current Medical History  Patient Admitting Diagnosis:  New diagnosed MS  History of Present Illness:  HPI: Brittany Hartman is a 51 y.o. right handed female with history of hypertension. Presented 10/24/2016 with progressive gait dysfunction over the past 6 years and recent increased falls. CT/MRI showed severe chronic supra-and infratentorial demyelination with superimposed hyperacute acute demyelinating lesions including left superior cerebellar peduncle. Neurology consulted placed on intravenous Solu-Medrol. MRI cervical and thoracic spine are pending. Subcutaneous Lovenox for DVT prophylaxis.     Past Medical History  Past Medical History:  Diagnosis Date  . Hypertension     Family History  family history includes Heart attack in her mother; Hypertension in her brother.  Prior Rehab/Hospitalizations:  Has the patient had major surgery during 100 days prior to admission? No  Current Medications   Current Facility-Administered Medications:  .  acetaminophen (TYLENOL) tablet 650 mg, 650 mg, Oral, Q6H PRN **OR** acetaminophen (TYLENOL) suppository 650 mg, 650 mg, Rectal, Q6H PRN, Alberteen Sam, MD .  amLODipine (NORVASC) tablet 10 mg, 10 mg, Oral, Daily, Richarda Overlie, MD, 10 mg at 10/28/16 0910 .  baclofen (LIORESAL) tablet 10 mg, 10 mg, Oral, BID, Weston Settle, MD, 10 mg at 10/28/16 2145 .  enoxaparin (LOVENOX) injection 40 mg, 40 mg, Subcutaneous, Q24H, Alberteen Sam, MD, 40 mg  at 10/28/16 0909 .  hydrALAZINE (APRESOLINE) injection 10 mg, 10 mg, Intravenous, Q4H PRN, Richarda Overlie, MD, 10 mg at 10/26/16 0526 .  hydrALAZINE (APRESOLINE) tablet 50 mg, 50 mg, Oral, TID, Richarda Overlie, MD, 50 mg at 10/28/16 2145 .  pantoprazole (PROTONIX) EC tablet 40 mg, 40 mg, Oral, Daily, Richarda Overlie, MD, 40 mg at 10/28/16 0910 .  predniSONE (DELTASONE) tablet 60 mg, 60 mg, Oral, QPM, Pearson Grippe, MD, 60 mg at 10/28/16 1812  Patients Current Diet: Diet regular Room service appropriate? Yes; Fluid consistency: Thin  Precautions / Restrictions Precautions Precautions: Fall Restrictions Weight Bearing Restrictions: No   Has the patient had 2 or more falls or a fall with injury in the past year?Yes  Prior Activity Level Noted decline in function over past 6 years, legs stiffening up.  Home Assistive Devices / Equipment Home Assistive Devices/Equipment: None Home Equipment: None  Prior Device Use: Indicate devices/aids used by the patient prior to current illness, exacerbation or injury? None of the above  Prior Functional Level Prior Function Level of Independence: Independent Comments: recent loss of job she had for one month due to fall at work  Self Care: Did the patient need help bathing, dressing, using the toilet or eating?  Independent  Indoor Mobility: Did the patient need assistance with walking from room to room (with or without device)? Independent  Stairs: Did the patient need assistance with internal or external stairs (with or without device)? Needed some help  Functional Cognition: Did the patient need help planning regular tasks such as shopping or  remembering to take medications? Needed some help  Current Functional Level Cognition  Overall Cognitive Status: No family/caregiver present to determine baseline cognitive functioning Orientation Level: Oriented X4 General Comments: Quick movements, not safe, very impulsive.  Difficulty following directions to  improve safety.    Extremity Assessment (includes Sensation/Coordination)  Upper Extremity Assessment: Generalized weakness  Lower Extremity Assessment: Generalized weakness    ADLs       Mobility  Overal bed mobility: Needs Assistance Bed Mobility: Supine to Sit, Sit to Supine Supine to sit: Min assist, HOB elevated Sit to supine: Mod assist, HOB elevated General bed mobility comments: used rail and elevated HOB to assist    Transfers  Overall transfer level: Needs assistance Equipment used: Rolling walker (2 wheeled) Transfers: Sit to/from Stand Sit to Stand: Mod assist General transfer comment: Repeated verbal cues for hand placement.  Patient continued to reach for RW to pull self up to standing.  Required mod assist to power up to standing x3.  Decreased balance in stance, requiring min guard to min assist for static standing balance.    Ambulation / Gait / Stairs / Wheelchair Mobility  Ambulation/Gait Ambulation/Gait assistance: Mod assist, +2 safety/equipment Ambulation Distance (Feet): 60 Feet (60' x2) Assistive device: Rolling walker (2 wheeled) Gait Pattern/deviations: Step-through pattern, Decreased stride length, Decreased weight shift to left, Ataxic, Staggering left, Staggering right, Trunk flexed, Wide base of support, Shuffle General Gait Details: Verbal cues for safe use of RW and to stand upright.  Patient requires assist to maneuver RW - running into wall/door jams.  During turns, moves RW in uncontrolled manner.  Difficulty keeping feet inside RW.  Patient with very ataxic, uncontrolled gait with poor balance.  High fall risk. Gait velocity: Quick, impulsive    Posture / Balance Dynamic Sitting Balance Sitting balance - Comments: seated EOB leans back due to on edge Balance Overall balance assessment: Needs assistance Sitting balance-Leahy Scale: Fair Sitting balance - Comments: seated EOB leans back due to on edge Standing balance support: Bilateral upper  extremity supported Standing balance-Leahy Scale: Poor Standing balance comment: Decreased balance in stance today.      Special needs/care consideration BiPAP/CPAP  N/a CPM  N/a Continuous Drip IV  N/a Dialysis  N/a Life Vest  N/a Oxygen  N/a Special Bed  N/a Trach Size  N/a Wound Vac (area)  N/a Skin No                              Bowel mgmt: Last BM 10/25/16 Bladder mgmt: WDL Diabetic mgmt Hgb A1C 5.5, no history of diabetes   Previous Home Environment Living Arrangements:  (lives with cousin for past month)  Lives With: Family Available Help at Discharge: Family, Available PRN/intermittently Type of Home: Other(Comment) (townhome) Home Layout: Two level, 1/2 bath on main level, Bed/bath upstairs Alternate Level Stairs-Rails: Right, Left Alternate Level Stairs-Number of Steps: 4 Home Access: Stairs to enter [employer] of Steps: 1 Bathroom Shower/Tub: Tub/shower unit, Engineer, building services: Standard Bathroom Accessibility: Yes How Accessible: Accessible via walker Home Care Services: No  Discharge Living Setting Plans for Discharge Living Setting: Lives with (comment) (pt plans to return to [place] to live with Uncle) Type of Home at Discharge: House Discharge Home Layout: One level Discharge Home Access: Stairs to enter Entrance Stairs-Rails: None Entrance Stairs-Number of Steps: 3 to 4 Discharge Bathroom Shower/Tub: Tub/shower unit, Curtain Discharge Bathroom Toilet: Standard Discharge Bathroom Accessibility: Yes How Accessible: Accessible  via walker Does the patient have any problems obtaining your medications?: Yes (Describe) (no insurance)  Paediatric nurse Information: Haywood Lasso, cousin Anticipated Caregiver: Uncle she plans to d/c home with Anticipated Caregiver's Contact Information: tbd Ability/Limitations of Caregiver: Uncle can provide supervision only Caregiver Availability: 24/7 Discharge Plan Discussed with Primary  Caregiver: Yes Is Caregiver In Agreement with Plan?: Yes Does Caregiver/Family have Issues with Lodging/Transportation while Pt is in Rehab?: No  Goals/Additional Needs Patient/Family Goal for Rehab: Mod I with PT and OT Expected length of stay: ELOS 3- 5 days Special Service Needs: pt recent move to [place] for one month, will plan to return to [place] to Uncle's home Pt/Family Agrees to Admission and willing to participate: Yes Program Orientation Provided & Reviewed with Pt/Caregiver Including Roles  & Responsibilities: Yes  Decrease burden of Care through IP rehab admission: n/a  Possible need for SNF placement upon discharge: not anticipated  Patient Condition: This patient's medical and functional status has changed since the consult dated 10/26/2016 in which the Rehabilitation Physician determined and documented that the patient was potentially appropriate for intensive rehabilitative care in an inpatient rehabilitation facility. Issues have been addressed and update has been discussed with Dr. Riley Kill and patient now appropriate for inpatient rehabilitation.  Patient currently requiring mod assist +2 to ambulate 60 feet X 2 RW.   Will admit to inpatient rehab today.   Preadmission Screen Completed By:  Trish Mage, 10/29/2016 11:34 AM ______________________________________________________________________   Discussed status with Dr. Allena Katz on 10/29/16 at 1133 and received telephone approval for admission today.  Admission Coordinator:  Trish Mage, time 1133/Date 10/29/16

## 2016-10-26 NOTE — Progress Notes (Addendum)
Physical Therapy Treatment Patient Details Name: Brittany Hartman MRN: [id number] DOB: 01/10/1966 Today's Date: 10/26/2016    History of Present Illness Brittany Hartman is a 51 y.o. female with a past medical history significant for hypertension untreated who presents with 6 months leg stiffness.  Recent tx for UTI.  MR brain was obtained which showed new lesions consistent with multiple sclerosis.    PT Comments    Patient much improved with stiffness in legs, still with decreased safety insight and is impulsive, creating elevated fall risk.  Patient still needing MOD assist overall for mobility and transfers.  Is appropriate to continue with PT services.   Follow Up Recommendations  CIR     Equipment Recommendations  Rolling walker with 5" wheels    Recommendations for Other Services       Precautions / Restrictions Precautions Precautions: Fall Restrictions Weight Bearing Restrictions: No    Mobility  Bed Mobility Overal bed mobility: Needs Assistance Bed Mobility: Supine to Sit     Supine to sit: Min assist;HOB elevated     General bed mobility comments: used rail and elevated HOB to assist  Transfers Overall transfer level: Needs assistance Equipment used: Rolling walker (2 wheeled);1 person hand held assist Transfers: Sit to/from Stand Sit to Stand: Min assist;Mod assist;+2 safety/equipment         General transfer comment: unsafe, decreased awareness of proximity to chair/bed, stands and sits from unsafe distances, near fall on one episode, patient states she thought the chair was closer.  Ambulation/Gait Ambulation/Gait assistance: Min assist;Mod assist;+2 safety/equipment Ambulation Distance (Feet): 150 Feet Assistive device: Rolling walker (2 wheeled) Gait Pattern/deviations: Wide base of support;Step-through pattern;Trunk flexed Gait velocity: Quick, impulsive   General Gait Details: Leans forward, walks quickly, cues to stay close to walker and  assist for turning (runs into corners without assist)   Stairs            Wheelchair Mobility    Modified Rankin (Stroke Patients Only)       Balance Overall balance assessment: Needs assistance   Sitting balance-Leahy Scale: Fair     Standing balance support: Single extremity supported Standing balance-Leahy Scale: Fair Standing balance comment: Can stand unsupported, difficulty with dynamic movement.                            Cognition Arousal/Alertness: Awake/alert Behavior During Therapy: Impulsive Overall Cognitive Status: No family/caregiver present to determine baseline cognitive functioning                                 General Comments: Quick movements, not safe, very impulsive      Exercises      General Comments        Pertinent Vitals/Pain Pain Assessment: No/denies pain    Home Living                      Prior Function            PT Goals (current goals can now be found in the care plan section) Acute Rehab PT Goals Patient Stated Goal: To go to rehab PT Goal Formulation: With patient Time For Goal Achievement: 11/01/16 Potential to Achieve Goals: Good Progress towards PT goals: Progressing toward goals    Frequency    Min 4X/week      PT Plan Current plan remains appropriate  Co-evaluation             End of Session Equipment Utilized During Treatment: Gait belt Activity Tolerance: Patient tolerated treatment well Patient left: in chair;with call bell/phone within reach;with chair alarm set;with nursing/sitter in room Nurse Communication: Mobility status PT Visit Diagnosis: Other abnormalities of gait and mobility (R26.89);Ataxic gait (R26.0);History of falling (Z91.81)     Time: 1610-9604 PT Time Calculation (min) (ACUTE ONLY): 25 min  Charges:  $Gait Training: 8-22 mins $Therapeutic Activity: 8-22 mins                    G Codes:       Brittany Hartman, DPT   Brittany Hartman  L 10/26/2016, 10:23 AM

## 2016-10-26 NOTE — Consult Note (Signed)
Physical Medicine and Rehabilitation Consult Reason for Consult: Gait disorder Referring Physician: [facility]   HPI: Brittany Hartman is a 51 y.o. right handed female with history of hypertension. Presented 10/24/2016 with progressive gait dysfunction over the past 6 years and recent increased falls. Per chart review patient lives with cousin independent prior to admission. 2 level home with bed and bath upstairs. Her cousin works during the day with family can check on her as needed. CT/MRI showed severe chronic supra-and infratentorial demyelination with superimposed hyperacute acute demyelinating lesions including left superior cerebellar peduncle. Neurology consulted placed on intravenous Solu-Medrol. MRI cervical and thoracic spine are pending. Subcutaneous Lovenox for DVT prophylaxis. Physical therapy evaluation completed with recommendations of physical medicine rehabilitation consult.   Review of Systems  Constitutional: Negative for chills and fever.  Eyes: Positive for blurred vision. Negative for double vision.  Respiratory: Negative for cough and shortness of breath.   Cardiovascular: Negative for chest pain, palpitations and leg swelling.  Gastrointestinal: Positive for constipation. Negative for nausea and vomiting.  Genitourinary: Negative for dysuria, flank pain and hematuria.  Musculoskeletal: Positive for falls and myalgias.  Skin: Negative for rash.  Neurological: Positive for sensory change and weakness. Negative for seizures.  All other systems reviewed and are negative.  Past Medical History:  Diagnosis Date  . Hypertension    History reviewed. No pertinent surgical history. Family History  Problem Relation Age of Onset  . Heart attack Mother   . Hypertension Brother    Social History:  reports that she has never smoked. She has never used smokeless tobacco. She reports that she does not drink alcohol. Her drug history is not on file. Allergies: No Known  Allergies Medications Prior to Admission  Medication Sig Dispense Refill  . cephALEXin (KEFLEX) 500 MG capsule 2 caps po bid x 7 days (Patient taking differently: Take 1,000 mg by mouth 2 (two) times daily. 2 caps po bid x 7 days) 28 capsule 0    Home: Home Living Family/patient expects to be discharged to:: Private residence Living Arrangements: Other relatives (cousin) Available Help at Discharge: Family, Available PRN/intermittently Type of Home: Other(Comment) (townhouse) Home Access: Stairs to enter Secretary/administrator of Steps: 1 Home Layout: Two level, Bed/bath upstairs Alternate Level Stairs-Number of Steps: 4 Alternate Level Stairs-Rails: Right, Left Bathroom Shower/Tub: Tub/shower unit Home Equipment: None  Functional History: Prior Function Level of Independence: Independent Functional Status:  Mobility: Bed Mobility Overal bed mobility: Needs Assistance Bed Mobility: Sit to Supine Sit to supine: Max assist General bed mobility comments: assist for legs into bed, then to adjust trunk, but pt able to pull herself up using headboard Transfers Overall transfer level: Needs assistance Equipment used: None Transfers: Sit to/from Stand Sit to Stand: Max assist General transfer comment: in room on EOB with nursing when I entered and pt standing up unaided, I supported her once up due to severe LOB; sat with uncontrolled descent on EOB near edge, assist to stand and reposition for safety   Assist down to toilet again with cues and assist due to uncontrolled descent Ambulation/Gait Ambulation/Gait assistance: Mod assist, Max assist Ambulation Distance (Feet): 12 Feet (x 2) Assistive device: Rolling walker (2 wheeled) Gait Pattern/deviations: Step-to pattern, Decreased stride length, Decreased weight shift to left, Wide base of support, Ataxic, Shuffle General Gait Details: heavy lean to R, assist to unweight R to allow swing, pt reported R LE giving out on her once close  to bed; assist for walker management  due to tilting walker to R with R lateral lean; also trouble accessing bathroom safely due to needing to side step and max cues and assist for safety    ADL:    Cognition: Cognition Overall Cognitive Status: No family/caregiver present to determine baseline cognitive functioning Orientation Level: Oriented X4 Cognition Arousal/Alertness: Awake/alert Behavior During Therapy: Impulsive Overall Cognitive Status: No family/caregiver present to determine baseline cognitive functioning  Blood pressure (!) 156/81, pulse 96, temperature 98.8 F (37.1 C), resp. rate 16, height 5\' 6"  (1.676 m), weight 120.8 kg (266 lb 4.8 oz), SpO2 96 %. Physical Exam  Vitals reviewed. Constitutional: She is oriented to person, place, and time.  HENT:  Head: Normocephalic.  Eyes: EOM are normal. Left eye exhibits no discharge.  Neck: Normal range of motion. Neck supple. No thyromegaly present.  Cardiovascular: Normal rate and regular rhythm.   Respiratory: Effort normal and breath sounds normal. No respiratory distress.  GI: Soft. Bowel sounds are normal. She exhibits no distension.  Neurological: She is alert and oriented to person, place, and time.  Good sitting balance.  5/5 deltoid, 5/5 bicep, 5/5 tricep, 5/5 wrist extension, 5/5 hand intrinsics.      5/5 hip flexor, 5/5 knee extension, 5/5 ankle dorsiflexion, 5/5 ankle plantaflexion. ?mild decrease LT RUE   Skin: Skin is warm and dry.  Psychiatric: She has a normal mood and affect. Her behavior is normal. Judgment and thought content normal.    Results for orders placed or performed during the hospital encounter of 10/24/16 (from the past 24 hour(s))  Glucose, capillary     Status: Abnormal   Collection Time: 10/25/16 12:17 PM  Result Value Ref Range   Glucose-Capillary 238 (H) 65 - 99 mg/dL  Hemoglobin W0J     Status: None   Collection Time: 10/25/16  1:31 PM  Result Value Ref Range   Hgb A1c MFr Bld 5.5 4.8  - 5.6 %   Mean Plasma Glucose 111 mg/dL  Glucose, capillary     Status: Abnormal   Collection Time: 10/25/16  5:12 PM  Result Value Ref Range   Glucose-Capillary 163 (H) 65 - 99 mg/dL  Basic metabolic panel     Status: Abnormal   Collection Time: 10/26/16  5:19 AM  Result Value Ref Range   Sodium 141 135 - 145 mmol/L   Potassium 3.2 (L) 3.5 - 5.1 mmol/L   Chloride 108 101 - 111 mmol/L   CO2 23 22 - 32 mmol/L   Glucose, Bld 160 (H) 65 - 99 mg/dL   BUN 15 6 - 20 mg/dL   Creatinine, Ser 8.11 (H) 0.44 - 1.00 mg/dL   Calcium 9.2 8.9 - 91.4 mg/dL   GFR calc non Af Amer >60 >60 mL/min   GFR calc Af Amer >60 >60 mL/min   Anion gap 10 5 - 15   Mr Brain W Wo Contrast  Result Date: 10/24/2016 CLINICAL DATA:  Leg stiffness, legs gave out today. Similar symptoms intermittently for 6 months. Evaluate gait abnormality. History of hypertension. EXAM: MRI HEAD WITHOUT CONTRAST TECHNIQUE: Multiplanar, multiecho pulse sequences of the brain and surrounding structures were obtained without intravenous contrast. COMPARISON:  CT HEAD October 18, 2016 FINDINGS: BRAIN: At least 6 infratentorial white matter lesions (including brainstem) infarct greater than 10 supratentorial white matter lesions which predominately radiate from the periventricular margin with low T1 signal compatible with black holes of demyelination. Multifocal T2 shine through and scattered areas of equivocal, possibly hyperacute demyelination. Faint enhancement within the LEFT  superior cerebellar peduncle, RIGHT periventricular frontal lobe, RIGHT frontal lobe, rostrum of the corpus callosum and to lesser extent along the periphery of LEFT periatrial white matter lesion. 7 mm LEFT basal ganglia lesion. LEFT parietal small cortical lesion. Dominant 11 mm LEFT frontal lobe lesion. Mild parenchymal brain volume loss for age. A few scattered chronic micro hemorrhages. No abnormal extra-axial fluid collections cord enhancement. VASCULAR: Normal major  intracranial vascular flow voids present at skull base. SKULL AND UPPER CERVICAL SPINE: No abnormal sellar expansion. No suspicious calvarial bone marrow signal. Craniocervical junction maintained. SINUSES/ORBITS: The mastoid air-cells and included paranasal sinuses are well-aerated. The included ocular globes and orbital contents are non-suspicious. OTHER: None. IMPRESSION: Severe chronic supra- and infratentorial demyelination with superimposed hyperacute and acute demyelinating lesions including LEFT superior cerebellar peduncle. Mild parenchymal brain volume loss for age. Electronically Signed   By: Awilda Metro M.D.   On: 10/24/2016 21:32    Assessment/Plan: Diagnosis: Newly diagnosed MS (although has had chronic symptoms) 1. Does the need for close, 24 hr/day medical supervision in concert with the patient's rehab needs make it unreasonable for this patient to be served in a less intensive setting? Yes 2. Co-Morbidities requiring supervision/potential complications: HTN 3. Due to bladder management, bowel management, safety, skin/wound care, disease management, medication administration, pain management and patient education, does the patient require 24 hr/day rehab nursing? Yes and Potentially 4. Does the patient require coordinated care of a physician, rehab nurse, PT (1-2 hrs/day, 5 days/week) and OT (1-2 hrs/day, 5 days/week) to address physical and functional deficits in the context of the above medical diagnosis(es)? Yes and Potentially Addressing deficits in the following areas: balance, endurance, locomotion, strength, transferring, bowel/bladder control, bathing, dressing, feeding, grooming, toileting and psychosocial support 5. Can the patient actively participate in an intensive therapy program of at least 3 hrs of therapy per day at least 5 days per week? Yes and Potentially 6. The potential for patient to make measurable gains while on inpatient rehab is good and  fair 7. Anticipated functional outcomes upon discharge from inpatient rehab are modified independent  with PT, modified independent with OT, n/a with SLP. 8. Estimated rehab length of stay to reach the above functional goals is: see below 9. Does the patient have adequate social supports and living environment to accommodate these discharge functional goals? Yes and Potentially 10. Anticipated D/C setting: Home 11. Anticipated post D/C treatments: HH therapy 12. Overall Rehab/Functional Prognosis: excellent  RECOMMENDATIONS: This patient's condition is appropriate for continued rehabilitative care in the following setting: Pt has shown neurological improvement with steroids. She feels that she's close to her baseline, however. Would like to see how she performs with therapy today. If she has persistent deficits which would preclude her from returning home safely (alone during the day), then we could pursue a brief CIR admission.  Patient has agreed to participate in recommended program. Yes Note that insurance prior authorization may be required for reimbursement for recommended care.  Comment: Rehab Admissions Coordinator to follow up.  Thanks,  Ranelle Oyster, MD, [place] [facility]    Charlton Amor., PA-C 10/26/2016

## 2016-10-26 NOTE — Progress Notes (Signed)
I met with pt at bedside to discuss a possible inpt rehab admission once medical workup complete and pending her functional progress with therapy. Pt states today she feels she will need a brief rehab stay, but depends on how she feels after steroids complete. I contacted Dr. Allyson Sabal for clarification of when felt medically ready. States need to complete scans and steroids therefore we will follow up on Monday. Pt made aware. [phone]

## 2016-10-27 ENCOUNTER — Ambulatory Visit: Payer: Self-pay

## 2016-10-27 LAB — BASIC METABOLIC PANEL
Anion gap: 9 (ref 5–15)
BUN: 19 mg/dL (ref 6–20)
CHLORIDE: 112 mmol/L — AB (ref 101–111)
CO2: 22 mmol/L (ref 22–32)
CREATININE: 1.1 mg/dL — AB (ref 0.44–1.00)
Calcium: 9.2 mg/dL (ref 8.9–10.3)
GFR calc Af Amer: >60 mL/min (ref >60)
GFR calc non Af Amer: 57 mL/min — ABNORMAL LOW (ref >60)
Glucose, Bld: 152 mg/dL — ABNORMAL HIGH (ref 65–99)
Potassium: 4.1 mmol/L (ref 3.5–5.1)
SODIUM: 143 mmol/L (ref 135–145)

## 2016-10-27 LAB — CBC
HCT: 40.6 % (ref 36.0–46.0)
Hemoglobin: 13.4 g/dL (ref 12.0–15.0)
MCH: 30.1 pg (ref 26.0–34.0)
MCHC: 33 g/dL (ref 30.0–36.0)
MCV: 91.2 fL (ref 78.0–100.0)
Platelets: 253 10*3/uL (ref 150–400)
RBC: 4.45 MIL/uL (ref 3.87–5.11)
RDW: 14.9 % (ref 11.5–15.5)
WBC: 10 10*3/uL (ref 4.0–10.5)

## 2016-10-27 LAB — GLUCOSE, CAPILLARY
GLUCOSE-CAPILLARY: 142 mg/dL — AB (ref 65–99)
Glucose-Capillary: 102 mg/dL — ABNORMAL HIGH (ref 65–99)

## 2016-10-27 MED ORDER — BACLOFEN 10 MG PO TABS
10.0000 mg | ORAL_TABLET | Freq: Two times a day (BID) | ORAL | Status: DC
  Administered 2016-10-27 – 2016-10-29 (×5): 10 mg via ORAL
  Filled 2016-10-27 (×5): qty 1

## 2016-10-27 NOTE — Progress Notes (Signed)
[facility] PROGRESS NOTE  Brittany Hartman:[id number] DOB: 08-30-1965 DOA: 10/24/2016   PCP: No PCP Per Patient     Assessment/Plan: Principal Problem:   Multiple sclerosis (HCC) Active Problems:   Essential hypertension   Acute cystitis with hematuria    Brittany Hartman is a 51 y.o. female with a past medical history significant for hypertension untreated who presents with 6 months leg stiffness. Now diagnosed with multiple sclerosis . MRI concerning for multiple sclerosis  Assessment and plan  1. New diagnosis of Multiple sclerosis with exacerbation:  IV Solu-Medrol 500 mg twice a day but neurology, started 4/12 around midnight( 0010 hrs )  Neurology, following MRI of the thoracic/cervical spine consistent with multiple sclerosis/demyelination Continue Protonix which was started secondary to high-dose steroids Deviation of steroids 3-5 days , depending on neurology recommendations  2. Hypertension:  uncontrolled exacerbated by steroids Started on oral hydralazine/continue Norvasc  3. Urinary tract infection:  Previous urine culture shows multiple species   Recollect urine which had multiple species before. Continue last 3 doses of cephalexin  4. Hypokalemia Repleted    DVT prophylaxsis  lovenox   Code Status:  Full code     Family Communication: Discussed in detail with the patient, all imaging results, lab results explained to the patient   Disposition Plan:  CIR Monday     Consultants:  neurology  Procedures:  NONE   Antibiotics: Anti-infectives    Start     Dose/Rate Route Frequency Ordered Stop   10/25/16 0115  cephALEXin (KEFLEX) capsule 1,000 mg    Comments:  2 caps po bid x 7 days     1,000 mg Oral 2 times daily with meals 10/25/16 0109 10/25/16 1558         HPI/Subjective: Patient continues to feel weak and requesting rehabilitation  Objective: Vitals:   10/26/16 1733 10/26/16 2148 10/26/16 2149 10/27/16 0533   BP: (!) 171/99 (!) 153/78 (!) 153/78 (!) 154/94  Pulse: 91  93 (!) 109  Resp:   16 16  Temp:   98.9 F (37.2 C) 97.8 F (36.6 C)  TempSrc:    Oral  SpO2: 99%  97% 99%  Weight:      Height:        Intake/Output Summary (Last 24 hours) at 10/27/16 0844 Last data filed at 10/26/16 1903  Gross per 24 hour  Intake              240 ml  Output              400 ml  Net             -160 ml    Exam:  Examination:  General exam: Appears calm and comfortable  Respiratory system: Clear to auscultation. Respiratory effort normal. Cardiovascular system: S1 & S2 heard, RRR. No JVD, murmurs, rubs, gallops or clicks. No pedal edema. Gastrointestinal system: Abdomen is nondistended, soft and nontender. No organomegaly or masses felt. Normal bowel sounds heard. Central nervous system: Alert and oriented. No focal neurological deficits. Extremities: Symmetric 5 x 5 power. Skin: No rashes, lesions or ulcers Psychiatry: Judgement and insight appear normal. Mood & affect appropriate.     Data Reviewed: I have personally reviewed following labs and imaging studies  Micro Results Recent Results (from the past 240 hour(s))  Urine culture     Status: Abnormal   Collection Time: 10/18/16 10:41 PM  Result Value Ref Range Status   Specimen Description URINE,  CLEAN CATCH  Final   Special Requests NONE  Final   Culture MULTIPLE SPECIES PRESENT, SUGGEST RECOLLECTION (A)  Final   Report Status 10/20/2016 FINAL  Final  Culture, Urine     Status: None   Collection Time: 10/24/16  6:23 PM  Result Value Ref Range Status   Specimen Description URINE, RANDOM  Final   Special Requests NONE  Final   Culture NO GROWTH  Final   Report Status 10/26/2016 FINAL  Final    Radiology Reports Ct Head Wo Contrast  Result Date: 10/18/2016 CLINICAL DATA:  Lightheadedness and vertigo. EXAM: CT HEAD WITHOUT CONTRAST TECHNIQUE: Contiguous axial images were obtained from the base of the skull through the vertex  without intravenous contrast. COMPARISON:  None. FINDINGS: Brain: There is no intracranial hemorrhage, mass or evidence of acute infarction. There is mild generalized atrophy. There is mild chronic microvascular ischemic change. There is no significant extra-axial fluid collection. Benign hyperostosis calcifications incidentally noted. No acute intracranial findings are evident. Vascular: No hyperdense vessel or unexpected calcification. Skull: Normal. Negative for fracture or focal lesion. Sinuses/Orbits: No acute finding. Other: None. IMPRESSION: No acute intracranial findings. There is mild generalized atrophy and chronic appearing white matter hypodensities which likely represent small vessel ischemic disease. Electronically Signed   By: Ellery Plunk M.D.   On: 10/18/2016 23:50   Mr Brittany Hartman Contrast  Result Date: 10/24/2016 CLINICAL DATA:  Leg stiffness, legs gave out today. Similar symptoms intermittently for 6 months. Evaluate gait abnormality. History of hypertension. EXAM: MRI HEAD WITHOUT CONTRAST TECHNIQUE: Multiplanar, multiecho pulse sequences of the brain and surrounding structures were obtained without intravenous contrast. COMPARISON:  CT HEAD October 18, 2016 FINDINGS: BRAIN: At least 6 infratentorial white matter lesions (including brainstem) infarct greater than 10 supratentorial white matter lesions which predominately radiate from the periventricular margin with low T1 signal compatible with black holes of demyelination. Multifocal T2 shine through and scattered areas of equivocal, possibly hyperacute demyelination. Faint enhancement within the LEFT superior cerebellar peduncle, RIGHT periventricular frontal lobe, RIGHT frontal lobe, rostrum of the corpus callosum and to lesser extent along the periphery of LEFT periatrial white matter lesion. 7 mm LEFT basal ganglia lesion. LEFT parietal small cortical lesion. Dominant 11 mm LEFT frontal lobe lesion. Mild parenchymal brain volume loss  for age. A few scattered chronic micro hemorrhages. No abnormal extra-axial fluid collections cord enhancement. VASCULAR: Normal major intracranial vascular flow voids present at skull base. SKULL AND UPPER CERVICAL SPINE: No abnormal sellar expansion. No suspicious calvarial bone marrow signal. Craniocervical junction maintained. SINUSES/ORBITS: The mastoid air-cells and included paranasal sinuses are well-aerated. The included ocular globes and orbital contents are non-suspicious. OTHER: None. IMPRESSION: Severe chronic supra- and infratentorial demyelination with superimposed hyperacute and acute demyelinating lesions including LEFT superior cerebellar peduncle. Mild parenchymal brain volume loss for age. Electronically Signed   By: Awilda Metro M.D.   On: 10/24/2016 21:32   Mr Cervical Spine W Wo Contrast  Result Date: 10/27/2016 CLINICAL DATA:  Initial evaluation for gait disturbance for 6 years. Recent diagnosis of multiple sclerosis. EXAM: MRI CERVICAL AND THORACIC SPINE WITHOUT AND WITH CONTRAST TECHNIQUE: Multiplanar and multiecho pulse sequences of the cervical spine, to include the craniocervical junction and cervicothoracic junction, and thoracic spine, were obtained without and with intravenous contrast. CONTRAST:  20mL MULTIHANCE GADOBENATE DIMEGLUMINE 529 MG/ML IV SOLN COMPARISON:  Prior MRI from 10/14/2016. FINDINGS: MRI CERVICAL SPINE FINDINGS Alignment: Straightening with slight reversal of the normal cervical lordosis. Trace anterolisthesis of  C3 on C4, C4 on C5, and C5 on C6. Vertebrae: Vertebral body heights maintained. No evidence for acute or chronic fracture. Signal intensity within the vertebral body bone marrow is normal. No abnormal marrow edema. No worrisome osseous lesions. Cord: Patchy T2 signal abnormality Ing seen at multiple levels within the cervical spinal cord, consistent with demyelinating disease. Patchy abnormality seen near the cervicomedullary junction (series 7,  image 1). Prominent focus present at the central dorsal aspect of the cord at the level of C2-3 (series 6, image 7). Prominent lesion at the left aspect of the cord inferiorly at C3-4 (series 7, image 10). Additional small lesion at the left posterior cord at C5-6 (series 7, image 18) and C6-7 (series 7, image 23). Patchy signal abnormality within the right aspect of the cord at C7-T1. No definite abnormal enhancement to suggest active demyelination. Posterior Fossa, vertebral arteries, paraspinal tissues: Extensive signal abnormality seen throughout the visualized brain, consistent with previous identified demyelinating disease. Associated abnormal enhancement at the left cerebellar peduncle again noted. Changes are better evaluated on prior brain MRI. Paraspinous and prevertebral soft tissues within normal limits. Normal intravascular flow voids present within the vertebral arteries bilaterally. Disc levels: C2-C3: Small central disc protrusion indents the ventral thecal sac without stenosis. C3-C4: Shallow broad posterior disc protrusion mildly indents the ventral thecal sac. No canal or foraminal stenosis. C4-C5: Broad posterior disc protrusion with a more focal central component indents and partially faces the ventral thecal sac. Protruding disc abuts the cervical spinal cord without cord flattening. Mild canal stenosis. Superimposed mild uncovertebral hypertrophy. No significant foraminal stenosis. C5-C6: Chronic diffuse degenerative disc osteophyte. Superimposed left paracentral disc protrusion indents the left ventral thecal sac. Mild flattening the left hemi cord. Resultant mild to moderate canal stenosis. Moderate left with more severe right C6 foraminal stenosis. C6-C7: Mild diffuse disc bulge with small central disc protrusion. No significant stenosis. C7-T1:  Unremarkable. MRI THORACIC SPINE FINDINGS Alignment: Vertebral bodies normally aligned with preservation of the normal thoracic kyphosis. No  listhesis. Vertebrae: Vertebral body heights well maintained. No evidence for acute or chronic fracture. Few scattered benign hemangiomas noted. Signal intensity within the vertebral body bone marrow otherwise normal. No worrisome osseous lesions. Cord: Extensive patchy T2/stir signal abnormality seen throughout the thoracic spinal cord, compatible with demyelinating disease. Changes most notable at the T5-6 level (series 6, image 18). Patchy post-contrast enhancement within the left aspect of the cord at the level of T3 suspicious for active demyelination (series 12, image 12). No other definite enhancement to suggest active demyelination. Overall caliber of the thoracic spinal cord within normal limits. Paraspinal and other soft tissues: Paraspinous soft tissues within normal limits. Visualized visceral structures are unremarkable. Disc levels: No significant degenerative changes are seen within the thoracic spine. No disc bulge or disc protrusion. No significant stenosis. IMPRESSION: MRI CERVICAL SPINE IMPRESSION: 1. Extensive patchy signal abnormality throughout the cervical spinal cord as above, consistent with demyelinating disease. No abnormal enhancement to suggest active demyelination. 2. Degenerative disc osteophyte at C5-6 with resultant mild to moderate canal and moderate to severe bilateral C6 foraminal stenosis, right worse than left. 3. Additional small disc protrusions at C2-3, C3-4, C4-5, and C6-7 as above without significant stenosis. MRI THORACIC SPINE IMPRESSION: Extensive patchy signal abnormality throughout the thoracic spinal cord, consistent with demyelinating disease. Faint patchy enhancement within the left aspect of the cord at T3 suspicious for active demyelination. No other definite evidence for active demyelination identified. Electronically Signed   By: Sharlet Salina  Phill Myron M.D.   On: 10/27/2016 02:09   Mr Thoracic Spine W Wo Contrast  Result Date: 10/27/2016 CLINICAL DATA:   Initial evaluation for gait disturbance for 6 years. Recent diagnosis of multiple sclerosis. EXAM: MRI CERVICAL AND THORACIC SPINE WITHOUT AND WITH CONTRAST TECHNIQUE: Multiplanar and multiecho pulse sequences of the cervical spine, to include the craniocervical junction and cervicothoracic junction, and thoracic spine, were obtained without and with intravenous contrast. CONTRAST:  20mL MULTIHANCE GADOBENATE DIMEGLUMINE 529 MG/ML IV SOLN COMPARISON:  Prior MRI from 10/14/2016. FINDINGS: MRI CERVICAL SPINE FINDINGS Alignment: Straightening with slight reversal of the normal cervical lordosis. Trace anterolisthesis of C3 on C4, C4 on C5, and C5 on C6. Vertebrae: Vertebral body heights maintained. No evidence for acute or chronic fracture. Signal intensity within the vertebral body bone marrow is normal. No abnormal marrow edema. No worrisome osseous lesions. Cord: Patchy T2 signal abnormality Ing seen at multiple levels within the cervical spinal cord, consistent with demyelinating disease. Patchy abnormality seen near the cervicomedullary junction (series 7, image 1). Prominent focus present at the central dorsal aspect of the cord at the level of C2-3 (series 6, image 7). Prominent lesion at the left aspect of the cord inferiorly at C3-4 (series 7, image 10). Additional small lesion at the left posterior cord at C5-6 (series 7, image 18) and C6-7 (series 7, image 23). Patchy signal abnormality within the right aspect of the cord at C7-T1. No definite abnormal enhancement to suggest active demyelination. Posterior Fossa, vertebral arteries, paraspinal tissues: Extensive signal abnormality seen throughout the visualized brain, consistent with previous identified demyelinating disease. Associated abnormal enhancement at the left cerebellar peduncle again noted. Changes are better evaluated on prior brain MRI. Paraspinous and prevertebral soft tissues within normal limits. Normal intravascular flow voids present within  the vertebral arteries bilaterally. Disc levels: C2-C3: Small central disc protrusion indents the ventral thecal sac without stenosis. C3-C4: Shallow broad posterior disc protrusion mildly indents the ventral thecal sac. No canal or foraminal stenosis. C4-C5: Broad posterior disc protrusion with a more focal central component indents and partially faces the ventral thecal sac. Protruding disc abuts the cervical spinal cord without cord flattening. Mild canal stenosis. Superimposed mild uncovertebral hypertrophy. No significant foraminal stenosis. C5-C6: Chronic diffuse degenerative disc osteophyte. Superimposed left paracentral disc protrusion indents the left ventral thecal sac. Mild flattening the left hemi cord. Resultant mild to moderate canal stenosis. Moderate left with more severe right C6 foraminal stenosis. C6-C7: Mild diffuse disc bulge with small central disc protrusion. No significant stenosis. C7-T1:  Unremarkable. MRI THORACIC SPINE FINDINGS Alignment: Vertebral bodies normally aligned with preservation of the normal thoracic kyphosis. No listhesis. Vertebrae: Vertebral body heights well maintained. No evidence for acute or chronic fracture. Few scattered benign hemangiomas noted. Signal intensity within the vertebral body bone marrow otherwise normal. No worrisome osseous lesions. Cord: Extensive patchy T2/stir signal abnormality seen throughout the thoracic spinal cord, compatible with demyelinating disease. Changes most notable at the T5-6 level (series 6, image 18). Patchy post-contrast enhancement within the left aspect of the cord at the level of T3 suspicious for active demyelination (series 12, image 12). No other definite enhancement to suggest active demyelination. Overall caliber of the thoracic spinal cord within normal limits. Paraspinal and other soft tissues: Paraspinous soft tissues within normal limits. Visualized visceral structures are unremarkable. Disc levels: No significant  degenerative changes are seen within the thoracic spine. No disc bulge or disc protrusion. No significant stenosis. IMPRESSION: MRI CERVICAL SPINE IMPRESSION: 1. Extensive patchy  signal abnormality throughout the cervical spinal cord as above, consistent with demyelinating disease. No abnormal enhancement to suggest active demyelination. 2. Degenerative disc osteophyte at C5-6 with resultant mild to moderate canal and moderate to severe bilateral C6 foraminal stenosis, right worse than left. 3. Additional small disc protrusions at C2-3, C3-4, C4-5, and C6-7 as above without significant stenosis. MRI THORACIC SPINE IMPRESSION: Extensive patchy signal abnormality throughout the thoracic spinal cord, consistent with demyelinating disease. Faint patchy enhancement within the left aspect of the cord at T3 suspicious for active demyelination. No other definite evidence for active demyelination identified. Electronically Signed   By: Rise Mu M.D.   On: 10/27/2016 02:09     CBC  Recent Labs Lab 10/24/16 1928 10/25/16 0524 10/27/16 0524  WBC 6.4 6.4 10.0  HGB 14.1 13.4 13.4  HCT 43.2 41.4 40.6  PLT 204 222 253  MCV 92.3 92.0 91.2  MCH 30.1 29.8 30.1  MCHC 32.6 32.4 33.0  RDW 14.3 14.6 14.9  LYMPHSABS 2.3  --   --   MONOABS 0.3  --   --   EOSABS 0.2  --   --   BASOSABS 0.0  --   --     Chemistries   Recent Labs Lab 10/24/16 1928 10/26/16 0519 10/27/16 0524  NA 144 141 143  K 3.5 3.2* 4.1  CL 105 108 112*  CO2 26 23 22   GLUCOSE 92 160* 152*  BUN 13 15 19   CREATININE 1.01* 1.01* 1.10*  CALCIUM 9.7 9.2 9.2  AST 21  --   --   ALT 28  --   --   ALKPHOS 93  --   --   BILITOT 0.7  --   --    ------------------------------------------------------------------------------------------------------------------ estimated creatinine clearance is 80.1 mL/min (A) (by C-G formula based on SCr of 1.1 mg/dL  (H)). ------------------------------------------------------------------------------------------------------------------  Recent Labs  10/25/16 1331  HGBA1C 5.5   ------------------------------------------------------------------------------------------------------------------ No results for input(s): CHOL, HDL, LDLCALC, TRIG, CHOLHDL, LDLDIRECT in the last 72 hours. ------------------------------------------------------------------------------------------------------------------ No results for input(s): TSH, T4TOTAL, T3FREE, THYROIDAB in the last 72 hours.  Invalid input(s): FREET3 ------------------------------------------------------------------------------------------------------------------ No results for input(s): VITAMINB12, FOLATE, FERRITIN, TIBC, IRON, RETICCTPCT in the last 72 hours.  Coagulation profile No results for input(s): INR, PROTIME in the last 168 hours.  No results for input(s): DDIMER in the last 72 hours.  Cardiac Enzymes No results for input(s): CKMB, TROPONINI, MYOGLOBIN in the last 168 hours.  Invalid input(s): CK ------------------------------------------------------------------------------------------------------------------ Invalid input(s): POCBNP   CBG:  Recent Labs Lab 10/25/16 1217 10/25/16 1712 10/26/16 1207 10/26/16 1650  GLUCAP 238* 163* 149* 155*       Studies: Mr Cervical Spine W Wo Contrast  Result Date: 10/27/2016 CLINICAL DATA:  Initial evaluation for gait disturbance for 6 years. Recent diagnosis of multiple sclerosis. EXAM: MRI CERVICAL AND THORACIC SPINE WITHOUT AND WITH CONTRAST TECHNIQUE: Multiplanar and multiecho pulse sequences of the cervical spine, to include the craniocervical junction and cervicothoracic junction, and thoracic spine, were obtained without and with intravenous contrast. CONTRAST:  20mL MULTIHANCE GADOBENATE DIMEGLUMINE 529 MG/ML IV SOLN COMPARISON:  Prior MRI from 10/14/2016. FINDINGS: MRI CERVICAL  SPINE FINDINGS Alignment: Straightening with slight reversal of the normal cervical lordosis. Trace anterolisthesis of C3 on C4, C4 on C5, and C5 on C6. Vertebrae: Vertebral body heights maintained. No evidence for acute or chronic fracture. Signal intensity within the vertebral body bone marrow is normal. No abnormal marrow edema. No worrisome osseous lesions. Cord: Patchy T2 signal abnormality  Ing seen at multiple levels within the cervical spinal cord, consistent with demyelinating disease. Patchy abnormality seen near the cervicomedullary junction (series 7, image 1). Prominent focus present at the central dorsal aspect of the cord at the level of C2-3 (series 6, image 7). Prominent lesion at the left aspect of the cord inferiorly at C3-4 (series 7, image 10). Additional small lesion at the left posterior cord at C5-6 (series 7, image 18) and C6-7 (series 7, image 23). Patchy signal abnormality within the right aspect of the cord at C7-T1. No definite abnormal enhancement to suggest active demyelination. Posterior Fossa, vertebral arteries, paraspinal tissues: Extensive signal abnormality seen throughout the visualized brain, consistent with previous identified demyelinating disease. Associated abnormal enhancement at the left cerebellar peduncle again noted. Changes are better evaluated on prior brain MRI. Paraspinous and prevertebral soft tissues within normal limits. Normal intravascular flow voids present within the vertebral arteries bilaterally. Disc levels: C2-C3: Small central disc protrusion indents the ventral thecal sac without stenosis. C3-C4: Shallow broad posterior disc protrusion mildly indents the ventral thecal sac. No canal or foraminal stenosis. C4-C5: Broad posterior disc protrusion with a more focal central component indents and partially faces the ventral thecal sac. Protruding disc abuts the cervical spinal cord without cord flattening. Mild canal stenosis. Superimposed mild uncovertebral  hypertrophy. No significant foraminal stenosis. C5-C6: Chronic diffuse degenerative disc osteophyte. Superimposed left paracentral disc protrusion indents the left ventral thecal sac. Mild flattening the left hemi cord. Resultant mild to moderate canal stenosis. Moderate left with more severe right C6 foraminal stenosis. C6-C7: Mild diffuse disc bulge with small central disc protrusion. No significant stenosis. C7-T1:  Unremarkable. MRI THORACIC SPINE FINDINGS Alignment: Vertebral bodies normally aligned with preservation of the normal thoracic kyphosis. No listhesis. Vertebrae: Vertebral body heights well maintained. No evidence for acute or chronic fracture. Few scattered benign hemangiomas noted. Signal intensity within the vertebral body bone marrow otherwise normal. No worrisome osseous lesions. Cord: Extensive patchy T2/stir signal abnormality seen throughout the thoracic spinal cord, compatible with demyelinating disease. Changes most notable at the T5-6 level (series 6, image 18). Patchy post-contrast enhancement within the left aspect of the cord at the level of T3 suspicious for active demyelination (series 12, image 12). No other definite enhancement to suggest active demyelination. Overall caliber of the thoracic spinal cord within normal limits. Paraspinal and other soft tissues: Paraspinous soft tissues within normal limits. Visualized visceral structures are unremarkable. Disc levels: No significant degenerative changes are seen within the thoracic spine. No disc bulge or disc protrusion. No significant stenosis. IMPRESSION: MRI CERVICAL SPINE IMPRESSION: 1. Extensive patchy signal abnormality throughout the cervical spinal cord as above, consistent with demyelinating disease. No abnormal enhancement to suggest active demyelination. 2. Degenerative disc osteophyte at C5-6 with resultant mild to moderate canal and moderate to severe bilateral C6 foraminal stenosis, right worse than left. 3. Additional  small disc protrusions at C2-3, C3-4, C4-5, and C6-7 as above without significant stenosis. MRI THORACIC SPINE IMPRESSION: Extensive patchy signal abnormality throughout the thoracic spinal cord, consistent with demyelinating disease. Faint patchy enhancement within the left aspect of the cord at T3 suspicious for active demyelination. No other definite evidence for active demyelination identified. Electronically Signed   By: Rise Mu M.D.   On: 10/27/2016 02:09   Mr Thoracic Spine W Wo Contrast  Result Date: 10/27/2016 CLINICAL DATA:  Initial evaluation for gait disturbance for 6 years. Recent diagnosis of multiple sclerosis. EXAM: MRI CERVICAL AND THORACIC SPINE WITHOUT AND WITH CONTRAST  TECHNIQUE: Multiplanar and multiecho pulse sequences of the cervical spine, to include the craniocervical junction and cervicothoracic junction, and thoracic spine, were obtained without and with intravenous contrast. CONTRAST:  20mL MULTIHANCE GADOBENATE DIMEGLUMINE 529 MG/ML IV SOLN COMPARISON:  Prior MRI from 10/14/2016. FINDINGS: MRI CERVICAL SPINE FINDINGS Alignment: Straightening with slight reversal of the normal cervical lordosis. Trace anterolisthesis of C3 on C4, C4 on C5, and C5 on C6. Vertebrae: Vertebral body heights maintained. No evidence for acute or chronic fracture. Signal intensity within the vertebral body bone marrow is normal. No abnormal marrow edema. No worrisome osseous lesions. Cord: Patchy T2 signal abnormality Ing seen at multiple levels within the cervical spinal cord, consistent with demyelinating disease. Patchy abnormality seen near the cervicomedullary junction (series 7, image 1). Prominent focus present at the central dorsal aspect of the cord at the level of C2-3 (series 6, image 7). Prominent lesion at the left aspect of the cord inferiorly at C3-4 (series 7, image 10). Additional small lesion at the left posterior cord at C5-6 (series 7, image 18) and C6-7 (series 7, image 23).  Patchy signal abnormality within the right aspect of the cord at C7-T1. No definite abnormal enhancement to suggest active demyelination. Posterior Fossa, vertebral arteries, paraspinal tissues: Extensive signal abnormality seen throughout the visualized brain, consistent with previous identified demyelinating disease. Associated abnormal enhancement at the left cerebellar peduncle again noted. Changes are better evaluated on prior brain MRI. Paraspinous and prevertebral soft tissues within normal limits. Normal intravascular flow voids present within the vertebral arteries bilaterally. Disc levels: C2-C3: Small central disc protrusion indents the ventral thecal sac without stenosis. C3-C4: Shallow broad posterior disc protrusion mildly indents the ventral thecal sac. No canal or foraminal stenosis. C4-C5: Broad posterior disc protrusion with a more focal central component indents and partially faces the ventral thecal sac. Protruding disc abuts the cervical spinal cord without cord flattening. Mild canal stenosis. Superimposed mild uncovertebral hypertrophy. No significant foraminal stenosis. C5-C6: Chronic diffuse degenerative disc osteophyte. Superimposed left paracentral disc protrusion indents the left ventral thecal sac. Mild flattening the left hemi cord. Resultant mild to moderate canal stenosis. Moderate left with more severe right C6 foraminal stenosis. C6-C7: Mild diffuse disc bulge with small central disc protrusion. No significant stenosis. C7-T1:  Unremarkable. MRI THORACIC SPINE FINDINGS Alignment: Vertebral bodies normally aligned with preservation of the normal thoracic kyphosis. No listhesis. Vertebrae: Vertebral body heights well maintained. No evidence for acute or chronic fracture. Few scattered benign hemangiomas noted. Signal intensity within the vertebral body bone marrow otherwise normal. No worrisome osseous lesions. Cord: Extensive patchy T2/stir signal abnormality seen throughout the  thoracic spinal cord, compatible with demyelinating disease. Changes most notable at the T5-6 level (series 6, image 18). Patchy post-contrast enhancement within the left aspect of the cord at the level of T3 suspicious for active demyelination (series 12, image 12). No other definite enhancement to suggest active demyelination. Overall caliber of the thoracic spinal cord within normal limits. Paraspinal and other soft tissues: Paraspinous soft tissues within normal limits. Visualized visceral structures are unremarkable. Disc levels: No significant degenerative changes are seen within the thoracic spine. No disc bulge or disc protrusion. No significant stenosis. IMPRESSION: MRI CERVICAL SPINE IMPRESSION: 1. Extensive patchy signal abnormality throughout the cervical spinal cord as above, consistent with demyelinating disease. No abnormal enhancement to suggest active demyelination. 2. Degenerative disc osteophyte at C5-6 with resultant mild to moderate canal and moderate to severe bilateral C6 foraminal stenosis, right worse than left. 3. Additional  small disc protrusions at C2-3, C3-4, C4-5, and C6-7 as above without significant stenosis. MRI THORACIC SPINE IMPRESSION: Extensive patchy signal abnormality throughout the thoracic spinal cord, consistent with demyelinating disease. Faint patchy enhancement within the left aspect of the cord at T3 suspicious for active demyelination. No other definite evidence for active demyelination identified. Electronically Signed   By: Rise Mu M.D.   On: 10/27/2016 02:09      Lab Results  Component Value Date   HGBA1C 5.5 10/25/2016   Lab Results  Component Value Date   CREATININE 1.10 (H) 10/27/2016       Scheduled Meds: . amLODipine  10 mg Oral Daily  . enoxaparin (LOVENOX) injection  40 mg Subcutaneous Q24H  . hydrALAZINE  50 mg Oral TID  . methylPREDNISolone (SOLU-MEDROL) injection  500 mg Intravenous Q12H  . pantoprazole  40 mg Oral Daily   . potassium chloride  40 mEq Oral BID   Continuous Infusions:   LOS: 3 days    Time spent: >30 MINS    Richarda Overlie  [facility] Pager [phone]. If 7PM-7AM, please contact night-coverage at [email or web address], password [facility] 10/27/2016, 8:44 AM  LOS: 3 days

## 2016-10-27 NOTE — Progress Notes (Signed)
Subjective: Interval History: Still feels some tightness in the legs.  Walking more.      Objective: Vital signs in last 24 hours: Temp:  [97.8 F (36.6 C)-98.9 F (37.2 C)] 97.8 F (36.6 C) (04/14 0533) Pulse Rate:  [91-109] 109 (04/14 0533) Resp:  [16] 16 (04/14 0533) BP: (153-171)/(78-99) 154/94 (04/14 0533) SpO2:  [97 %-99 %] 99 % (04/14 0533)  Intake/Output from previous day: 04/13 0701 - 04/14 0700 In: 240 [P.O.:240] Out: 700 [Urine:700] Intake/Output this shift: No intake/output data recorded. Nutritional status: Diet regular Room service appropriate? Yes; Fluid consistency: Thin  Neurologic Exam:  Mild leg spasticity. 4/5 weakness in the legs. 5/5 in the arms. Hyperreflexic.  Equivocal babinsksi. CNs intact.  Lab Results:  Recent Labs  10/25/16 0524 10/26/16 0519 10/27/16 0524  WBC 6.4  --  10.0  HGB 13.4  --  13.4  HCT 41.4  --  40.6  PLT 222  --  253  NA  --  141 143  K  --  3.2* 4.1  CL  --  108 112*  CO2  --  23 22  GLUCOSE  --  160* 152*  BUN  --  15 19  CREATININE  --  1.01* 1.10*  CALCIUM  --  9.2 9.2   Lipid Panel No results for input(s): CHOL, TRIG, HDL, CHOLHDL, VLDL, LDLCALC in the last 72 hours.  Studies/Results: Mr Cervical Spine W Wo Contrast  Result Date: 10/27/2016 CLINICAL DATA:  Initial evaluation for gait disturbance for 6 years. Recent diagnosis of multiple sclerosis. EXAM: MRI CERVICAL AND THORACIC SPINE WITHOUT AND WITH CONTRAST TECHNIQUE: Multiplanar and multiecho pulse sequences of the cervical spine, to include the craniocervical junction and cervicothoracic junction, and thoracic spine, were obtained without and with intravenous contrast. CONTRAST:  27mL MULTIHANCE GADOBENATE DIMEGLUMINE 529 MG/ML IV SOLN COMPARISON:  Prior MRI from 10/14/2016. FINDINGS: MRI CERVICAL SPINE FINDINGS Alignment: Straightening with slight reversal of the normal cervical lordosis. Trace anterolisthesis of C3 on C4, C4 on C5, and C5 on C6.  Vertebrae: Vertebral body heights maintained. No evidence for acute or chronic fracture. Signal intensity within the vertebral body bone marrow is normal. No abnormal marrow edema. No worrisome osseous lesions. Cord: Patchy T2 signal abnormality Ing seen at multiple levels within the cervical spinal cord, consistent with demyelinating disease. Patchy abnormality seen near the cervicomedullary junction (series 7, image 1). Prominent focus present at the central dorsal aspect of the cord at the level of C2-3 (series 6, image 7). Prominent lesion at the left aspect of the cord inferiorly at C3-4 (series 7, image 10). Additional small lesion at the left posterior cord at C5-6 (series 7, image 18) and C6-7 (series 7, image 23). Patchy signal abnormality within the right aspect of the cord at C7-T1. No definite abnormal enhancement to suggest active demyelination. Posterior Fossa, vertebral arteries, paraspinal tissues: Extensive signal abnormality seen throughout the visualized brain, consistent with previous identified demyelinating disease. Associated abnormal enhancement at the left cerebellar peduncle again noted. Changes are better evaluated on prior brain MRI. Paraspinous and prevertebral soft tissues within normal limits. Normal intravascular flow voids present within the vertebral arteries bilaterally. Disc levels: C2-C3: Small central disc protrusion indents the ventral thecal sac without stenosis. C3-C4: Shallow broad posterior disc protrusion mildly indents the ventral thecal sac. No canal or foraminal stenosis. C4-C5: Broad posterior disc protrusion with a more focal central component indents and partially faces the ventral thecal sac. Protruding disc abuts the cervical spinal cord without  cord flattening. Mild canal stenosis. Superimposed mild uncovertebral hypertrophy. No significant foraminal stenosis. C5-C6: Chronic diffuse degenerative disc osteophyte. Superimposed left paracentral disc protrusion  indents the left ventral thecal sac. Mild flattening the left hemi cord. Resultant mild to moderate canal stenosis. Moderate left with more severe right C6 foraminal stenosis. C6-C7: Mild diffuse disc bulge with small central disc protrusion. No significant stenosis. C7-T1:  Unremarkable. MRI THORACIC SPINE FINDINGS Alignment: Vertebral bodies normally aligned with preservation of the normal thoracic kyphosis. No listhesis. Vertebrae: Vertebral body heights well maintained. No evidence for acute or chronic fracture. Few scattered benign hemangiomas noted. Signal intensity within the vertebral body bone marrow otherwise normal. No worrisome osseous lesions. Cord: Extensive patchy T2/stir signal abnormality seen throughout the thoracic spinal cord, compatible with demyelinating disease. Changes most notable at the T5-6 level (series 6, image 18). Patchy post-contrast enhancement within the left aspect of the cord at the level of T3 suspicious for active demyelination (series 12, image 12). No other definite enhancement to suggest active demyelination. Overall caliber of the thoracic spinal cord within normal limits. Paraspinal and other soft tissues: Paraspinous soft tissues within normal limits. Visualized visceral structures are unremarkable. Disc levels: No significant degenerative changes are seen within the thoracic spine. No disc bulge or disc protrusion. No significant stenosis. IMPRESSION: MRI CERVICAL SPINE IMPRESSION: 1. Extensive patchy signal abnormality throughout the cervical spinal cord as above, consistent with demyelinating disease. No abnormal enhancement to suggest active demyelination. 2. Degenerative disc osteophyte at C5-6 with resultant mild to moderate canal and moderate to severe bilateral C6 foraminal stenosis, right worse than left. 3. Additional small disc protrusions at C2-3, C3-4, C4-5, and C6-7 as above without significant stenosis. MRI THORACIC SPINE IMPRESSION: Extensive patchy signal  abnormality throughout the thoracic spinal cord, consistent with demyelinating disease. Faint patchy enhancement within the left aspect of the cord at T3 suspicious for active demyelination. No other definite evidence for active demyelination identified. Electronically Signed   By: Rise Mu M.D.   On: 10/27/2016 02:09   Mr Thoracic Spine W Wo Contrast  Result Date: 10/27/2016 CLINICAL DATA:  Initial evaluation for gait disturbance for 6 years. Recent diagnosis of multiple sclerosis. EXAM: MRI CERVICAL AND THORACIC SPINE WITHOUT AND WITH CONTRAST TECHNIQUE: Multiplanar and multiecho pulse sequences of the cervical spine, to include the craniocervical junction and cervicothoracic junction, and thoracic spine, were obtained without and with intravenous contrast. CONTRAST:  20mL MULTIHANCE GADOBENATE DIMEGLUMINE 529 MG/ML IV SOLN COMPARISON:  Prior MRI from 10/14/2016. FINDINGS: MRI CERVICAL SPINE FINDINGS Alignment: Straightening with slight reversal of the normal cervical lordosis. Trace anterolisthesis of C3 on C4, C4 on C5, and C5 on C6. Vertebrae: Vertebral body heights maintained. No evidence for acute or chronic fracture. Signal intensity within the vertebral body bone marrow is normal. No abnormal marrow edema. No worrisome osseous lesions. Cord: Patchy T2 signal abnormality Ing seen at multiple levels within the cervical spinal cord, consistent with demyelinating disease. Patchy abnormality seen near the cervicomedullary junction (series 7, image 1). Prominent focus present at the central dorsal aspect of the cord at the level of C2-3 (series 6, image 7). Prominent lesion at the left aspect of the cord inferiorly at C3-4 (series 7, image 10). Additional small lesion at the left posterior cord at C5-6 (series 7, image 18) and C6-7 (series 7, image 23). Patchy signal abnormality within the right aspect of the cord at C7-T1. No definite abnormal enhancement to suggest active demyelination.  Posterior Fossa, vertebral arteries, paraspinal tissues:  Extensive signal abnormality seen throughout the visualized brain, consistent with previous identified demyelinating disease. Associated abnormal enhancement at the left cerebellar peduncle again noted. Changes are better evaluated on prior brain MRI. Paraspinous and prevertebral soft tissues within normal limits. Normal intravascular flow voids present within the vertebral arteries bilaterally. Disc levels: C2-C3: Small central disc protrusion indents the ventral thecal sac without stenosis. C3-C4: Shallow broad posterior disc protrusion mildly indents the ventral thecal sac. No canal or foraminal stenosis. C4-C5: Broad posterior disc protrusion with a more focal central component indents and partially faces the ventral thecal sac. Protruding disc abuts the cervical spinal cord without cord flattening. Mild canal stenosis. Superimposed mild uncovertebral hypertrophy. No significant foraminal stenosis. C5-C6: Chronic diffuse degenerative disc osteophyte. Superimposed left paracentral disc protrusion indents the left ventral thecal sac. Mild flattening the left hemi cord. Resultant mild to moderate canal stenosis. Moderate left with more severe right C6 foraminal stenosis. C6-C7: Mild diffuse disc bulge with small central disc protrusion. No significant stenosis. C7-T1:  Unremarkable. MRI THORACIC SPINE FINDINGS Alignment: Vertebral bodies normally aligned with preservation of the normal thoracic kyphosis. No listhesis. Vertebrae: Vertebral body heights well maintained. No evidence for acute or chronic fracture. Few scattered benign hemangiomas noted. Signal intensity within the vertebral body bone marrow otherwise normal. No worrisome osseous lesions. Cord: Extensive patchy T2/stir signal abnormality seen throughout the thoracic spinal cord, compatible with demyelinating disease. Changes most notable at the T5-6 level (series 6, image 18). Patchy post-contrast  enhancement within the left aspect of the cord at the level of T3 suspicious for active demyelination (series 12, image 12). No other definite enhancement to suggest active demyelination. Overall caliber of the thoracic spinal cord within normal limits. Paraspinal and other soft tissues: Paraspinous soft tissues within normal limits. Visualized visceral structures are unremarkable. Disc levels: No significant degenerative changes are seen within the thoracic spine. No disc bulge or disc protrusion. No significant stenosis. IMPRESSION: MRI CERVICAL SPINE IMPRESSION: 1. Extensive patchy signal abnormality throughout the cervical spinal cord as above, consistent with demyelinating disease. No abnormal enhancement to suggest active demyelination. 2. Degenerative disc osteophyte at C5-6 with resultant mild to moderate canal and moderate to severe bilateral C6 foraminal stenosis, right worse than left. 3. Additional small disc protrusions at C2-3, C3-4, C4-5, and C6-7 as above without significant stenosis. MRI THORACIC SPINE IMPRESSION: Extensive patchy signal abnormality throughout the thoracic spinal cord, consistent with demyelinating disease. Faint patchy enhancement within the left aspect of the cord at T3 suspicious for active demyelination. No other definite evidence for active demyelination identified. Electronically Signed   By: Rise Mu M.D.   On: 10/27/2016 02:09    Medications:  Prior to Admission:  Prescriptions Prior to Admission  Medication Sig Dispense Refill Last Dose  . cephALEXin (KEFLEX) 500 MG capsule 2 caps po bid x 7 days (Patient taking differently: Take 1,000 mg by mouth 2 (two) times daily. 2 caps po bid x 7 days) 28 capsule 0 10/24/2016 at am   Scheduled: . amLODipine  10 mg Oral Daily  . enoxaparin (LOVENOX) injection  40 mg Subcutaneous Q24H  . hydrALAZINE  50 mg Oral TID  . methylPREDNISolone (SOLU-MEDROL) injection  500 mg Intravenous Q12H  . pantoprazole  40 mg Oral  Daily  . potassium chloride  40 mEq Oral BID    Assessment/Plan:  Multiple sclerosis - spine imaging shows multiple demyelinating lesions throughout the cervical and thoracic spine.  These are the main source of her spasticity and gait  disturbance.    Once she gets her last dose of IV solumedrol today, you can switch her to oral taper for 11 days.    I will give her Baclofen for spasticity.    She needs to see outpatient neurologist for long term immunomodulatory treatment.  I recommend either IV Ocrevus or IV Lemtrada.    You may discharge her tomorrow from my perspective.  Will sign off.     LOS: 3 days   Weston Settle, MD 10/27/2016  9:13 AM

## 2016-10-27 NOTE — Progress Notes (Signed)
Physical Therapy Treatment Patient Details Name: Brittany Hartman MRN: [id number] DOB: 1965-07-24 Today's Date: 10/27/2016    History of Present Illness Brittany Hartman is a 51 y.o. female with a past medical history significant for hypertension untreated who presents with 6 months leg stiffness.  Recent tx for UTI.  MR brain was obtained which showed new lesions consistent with multiple sclerosis.    PT Comments    Patient continues to exhibit decreased strength, coordination, balance impacting mobility/gait/safety.  Agree with need for CIR stay prior to return home.   Follow Up Recommendations  CIR     Equipment Recommendations  Rolling walker with 5" wheels    Recommendations for Other Services       Precautions / Restrictions Precautions Precautions: Fall Restrictions Weight Bearing Restrictions: No    Mobility  Bed Mobility Overal bed mobility: Needs Assistance Bed Mobility: Supine to Sit;Sit to Supine     Supine to sit: Min assist;HOB elevated Sit to supine: Mod assist;HOB elevated   General bed mobility comments: used rail and elevated HOB to assist  Transfers Overall transfer level: Needs assistance Equipment used: Rolling walker (2 wheeled) Transfers: Sit to/from Stand Sit to Stand: Mod assist         General transfer comment: Repeated verbal cues for hand placement.  Patient continued to reach for RW to pull self up to standing.  Required mod assist to power up to standing x3.  Decreased balance in stance, requiring min guard to min assist for static standing balance.  Ambulation/Gait Ambulation/Gait assistance: Mod assist;+2 safety/equipment Ambulation Distance (Feet): 60 Feet (60' x2) Assistive device: Rolling walker (2 wheeled) Gait Pattern/deviations: Step-through pattern;Decreased stride length;Decreased weight shift to left;Ataxic;Staggering left;Staggering right;Trunk flexed;Wide base of support;Shuffle Gait velocity: Quick, impulsive   General  Gait Details: Verbal cues for safe use of RW and to stand upright.  Patient requires assist to maneuver RW - running into wall/door jams.  During turns, moves RW in uncontrolled manner.  Difficulty keeping feet inside RW.  Patient with very ataxic, uncontrolled gait with poor balance.  High fall risk.   Stairs            Wheelchair Mobility    Modified Rankin (Stroke Patients Only)       Balance     Sitting balance-Leahy Scale: Fair     Standing balance support: Bilateral upper extremity supported Standing balance-Leahy Scale: Poor Standing balance comment: Decreased balance in stance today.                              Cognition Arousal/Alertness: Awake/alert Behavior During Therapy: Impulsive Overall Cognitive Status: No family/caregiver present to determine baseline cognitive functioning                                 General Comments: Quick movements, not safe, very impulsive.  Difficulty following directions to improve safety.      Exercises      General Comments        Pertinent Vitals/Pain Pain Assessment: Faces Faces Pain Scale: Hurts little more Pain Location: Head Pain Descriptors / Indicators: Headache Pain Intervention(s): Monitored during session;Patient requesting pain meds-RN notified    Home Living                      Prior Function  PT Goals (current goals can now be found in the care plan section) Acute Rehab PT Goals Patient Stated Goal: To go to rehab Progress towards PT goals: Progressing toward goals    Frequency    Min 4X/week      PT Plan Current plan remains appropriate    Co-evaluation             End of Session Equipment Utilized During Treatment: Gait belt Activity Tolerance: Patient tolerated treatment well Patient left: in bed;with call bell/phone within reach;with bed alarm set;with family/visitor present Nurse Communication: Mobility status;Patient requests  pain meds PT Visit Diagnosis: Other abnormalities of gait and mobility (R26.89);Ataxic gait (R26.0);History of falling (Z91.81)     Time: 1410-1433 PT Time Calculation (min) (ACUTE ONLY): 23 min  Charges:  $Gait Training: 23-37 mins                    G Codes:       Brittany Hartman. Brittany Hartman, [facility] Acute Rehab Services Pager [phone]    Brittany Hartman 10/27/2016, 3:20 PM

## 2016-10-28 MED ORDER — PREDNISONE 50 MG PO TABS
60.0000 mg | ORAL_TABLET | Freq: Every evening | ORAL | Status: DC
  Administered 2016-10-28 – 2016-10-29 (×2): 60 mg via ORAL
  Filled 2016-10-28 (×2): qty 1

## 2016-10-28 NOTE — Progress Notes (Signed)
Patient ID: Brittany Hartman, female   DOB: 07-Jul-1966, 51 y.o.   MRN: [id number]                                                                PROGRESS NOTE                                                                                                                                                                                                             Patient Demographics:    Brittany Hartman, is a 51 y.o. female, DOB - 10-Mar-1966, ONG:[id number]  Admit date - 10/24/2016   Admitting Physician Alberteen Sam, MD  Outpatient Primary MD for the patient is No PCP Per Patient  LOS - 4  Outpatient Specialists:   Chief Complaint  Patient presents with  . Gait Problem       Brief Narrative  51 y.o.femalewith a past medical history significant for hypertension untreatedwho presents with 6 months leg stiffness. Now diagnosed with multiple sclerosis . MRI concerning for multiple sclerosis   Subjective:    [name] today still feeling weak.  Pt has not yet had CIR evaluation.  Awaiting that for Monday.  Otherwise. Pt feeling better.   No headache, No chest pain, No abdominal pain - No Nausea, No new weakness tingling or numbness, No Cough - SOB.    Assessment  & Plan :    Principal Problem:   Multiple sclerosis (HCC) Active Problems:   Essential hypertension   Acute cystitis with hematuria   1. New diagnosis of Multiple sclerosis with exacerbation: IV Solu-Medrol 500 mg twice a day but neurology, started 4/12 around midnight( 0010 hrs ) MRI of the thoracic/cervical spine consistent with multiple sclerosis/demyelination Continue Protonix which was started secondary to high-dose steroids Neurology input appreciated Convert to prednisone 60mg  po qday and taper over 12 days.    2. Hypertension: uncontrolled exacerbated by steroids Started on oral hydralazine during this admission, continue Norvasc  3. Urinary tract infection: Pt completed keflex  4.  Hypokalemia Repleted Check cmp in am    DVT prophylaxsis  lovenox   Code Status:  Full code      Family Communication: Discussed in detail with the patient, all imaging results, lab results explained to the patient   Disposition Plan:  CIR Monday and  then discharge depending upon their recommendations       Lab Results  Component Value Date   PLT 253 10/27/2016     Anti-infectives    Start     Dose/Rate Route Frequency Ordered Stop   10/25/16 0115  cephALEXin (KEFLEX) capsule 1,000 mg    Comments:  2 caps po bid x 7 days     1,000 mg Oral 2 times daily with meals 10/25/16 0109 10/25/16 1558        Objective:   Vitals:   10/27/16 0533 10/27/16 1551 10/27/16 2109 10/28/16 0546  BP: (!) 154/94 (!) 151/72 (!) 164/93 (!) 155/92  Pulse: (!) 109 89 86 84  Resp: 16  18 18   Temp: 97.8 F (36.6 C) 97.9 F (36.6 C) 98.2 F (36.8 C) 98.4 F (36.9 C)  TempSrc: Oral  Oral Oral  SpO2: 99% 100% 100% 97%  Weight:      Height:        Wt Readings from Last 3 Encounters:  10/25/16 120.8 kg (266 lb 4.8 oz)  10/18/16 102.1 kg (225 lb)     Intake/Output Summary (Last 24 hours) at 10/28/16 1610 Last data filed at 10/28/16 1011  Gross per 24 hour  Intake              814 ml  Output              650 ml  Net              164 ml     Physical Exam  Awake Alert, Oriented X 3, No new F.N deficits, Normal affect [place].AT,PERRAL Supple Neck,No JVD, No cervical lymphadenopathy appriciated.  Symmetrical Chest wall movement, Good air movement bilaterally, CTAB RRR,No Gallops,Rubs or new Murmurs, No Parasternal Heave +ve B.Sounds, Abd Soft, No tenderness, No organomegaly appriciated, No rebound - guarding or rigidity. No Cyanosis, Clubbing or edema, No new Rash or bruise      Data Review:    CBC  Recent Labs Lab 10/24/16 1928 10/25/16 0524 10/27/16 0524  WBC 6.4 6.4 10.0  HGB 14.1 13.4 13.4  HCT 43.2 41.4 40.6  PLT 204 222 253  MCV 92.3 92.0 91.2  MCH 30.1  29.8 30.1  MCHC 32.6 32.4 33.0  RDW 14.3 14.6 14.9  LYMPHSABS 2.3  --   --   MONOABS 0.3  --   --   EOSABS 0.2  --   --   BASOSABS 0.0  --   --     Chemistries   Recent Labs Lab 10/24/16 1928 10/26/16 0519 10/27/16 0524  NA 144 141 143  K 3.5 3.2* 4.1  CL 105 108 112*  CO2 26 23 22   GLUCOSE 92 160* 152*  BUN 13 15 19   CREATININE 1.01* 1.01* 1.10*  CALCIUM 9.7 9.2 9.2  AST 21  --   --   ALT 28  --   --   ALKPHOS 93  --   --   BILITOT 0.7  --   --    ------------------------------------------------------------------------------------------------------------------ No results for input(s): CHOL, HDL, LDLCALC, TRIG, CHOLHDL, LDLDIRECT in the last 72 hours.  Lab Results  Component Value Date   HGBA1C 5.5 10/25/2016   ------------------------------------------------------------------------------------------------------------------ No results for input(s): TSH, T4TOTAL, T3FREE, THYROIDAB in the last 72 hours.  Invalid input(s): FREET3 ------------------------------------------------------------------------------------------------------------------ No results for input(s): VITAMINB12, FOLATE, FERRITIN, TIBC, IRON, RETICCTPCT in the last 72 hours.  Coagulation profile No results for input(s): INR, PROTIME in the last 168 hours.  No results for input(s): DDIMER in the last 72 hours.  Cardiac Enzymes No results for input(s): CKMB, TROPONINI, MYOGLOBIN in the last 168 hours.  Invalid input(s): CK ------------------------------------------------------------------------------------------------------------------ No results found for: BNP  Inpatient Medications  Scheduled Meds: . amLODipine  10 mg Oral Daily  . baclofen  10 mg Oral BID  . enoxaparin (LOVENOX) injection  40 mg Subcutaneous Q24H  . hydrALAZINE  50 mg Oral TID  . methylPREDNISolone (SOLU-MEDROL) injection  500 mg Intravenous Q12H  . pantoprazole  40 mg Oral Daily   Continuous Infusions: PRN  Meds:.acetaminophen **OR** acetaminophen, hydrALAZINE  Micro Results Recent Results (from the past 240 hour(s))  Urine culture     Status: Abnormal   Collection Time: 10/18/16 10:41 PM  Result Value Ref Range Status   Specimen Description URINE, CLEAN CATCH  Final   Special Requests NONE  Final   Culture MULTIPLE SPECIES PRESENT, SUGGEST RECOLLECTION (A)  Final   Report Status 10/20/2016 FINAL  Final  Culture, Urine     Status: None   Collection Time: 10/24/16  6:23 PM  Result Value Ref Range Status   Specimen Description URINE, RANDOM  Final   Special Requests NONE  Final   Culture NO GROWTH  Final   Report Status 10/26/2016 FINAL  Final    Radiology Reports Ct Head Wo Contrast  Result Date: 10/18/2016 CLINICAL DATA:  Lightheadedness and vertigo. EXAM: CT HEAD WITHOUT CONTRAST TECHNIQUE: Contiguous axial images were obtained from the base of the skull through the vertex without intravenous contrast. COMPARISON:  None. FINDINGS: Brain: There is no intracranial hemorrhage, mass or evidence of acute infarction. There is mild generalized atrophy. There is mild chronic microvascular ischemic change. There is no significant extra-axial fluid collection. Benign hyperostosis calcifications incidentally noted. No acute intracranial findings are evident. Vascular: No hyperdense vessel or unexpected calcification. Skull: Normal. Negative for fracture or focal lesion. Sinuses/Orbits: No acute finding. Other: None. IMPRESSION: No acute intracranial findings. There is mild generalized atrophy and chronic appearing white matter hypodensities which likely represent small vessel ischemic disease. Electronically Signed   By: Ellery Plunk M.D.   On: 10/18/2016 23:50   Mr Laqueta Jean WG Contrast  Result Date: 10/24/2016 CLINICAL DATA:  Leg stiffness, legs gave out today. Similar symptoms intermittently for 6 months. Evaluate gait abnormality. History of hypertension. EXAM: MRI HEAD WITHOUT CONTRAST  TECHNIQUE: Multiplanar, multiecho pulse sequences of the brain and surrounding structures were obtained without intravenous contrast. COMPARISON:  CT HEAD October 18, 2016 FINDINGS: BRAIN: At least 6 infratentorial white matter lesions (including brainstem) infarct greater than 10 supratentorial white matter lesions which predominately radiate from the periventricular margin with low T1 signal compatible with black holes of demyelination. Multifocal T2 shine through and scattered areas of equivocal, possibly hyperacute demyelination. Faint enhancement within the LEFT superior cerebellar peduncle, RIGHT periventricular frontal lobe, RIGHT frontal lobe, rostrum of the corpus callosum and to lesser extent along the periphery of LEFT periatrial white matter lesion. 7 mm LEFT basal ganglia lesion. LEFT parietal small cortical lesion. Dominant 11 mm LEFT frontal lobe lesion. Mild parenchymal brain volume loss for age. A few scattered chronic micro hemorrhages. No abnormal extra-axial fluid collections cord enhancement. VASCULAR: Normal major intracranial vascular flow voids present at skull base. SKULL AND UPPER CERVICAL SPINE: No abnormal sellar expansion. No suspicious calvarial bone marrow signal. Craniocervical junction maintained. SINUSES/ORBITS: The mastoid air-cells and included paranasal sinuses are well-aerated. The included ocular globes and orbital contents are non-suspicious. OTHER: None. IMPRESSION: Severe  chronic supra- and infratentorial demyelination with superimposed hyperacute and acute demyelinating lesions including LEFT superior cerebellar peduncle. Mild parenchymal brain volume loss for age. Electronically Signed   By: Awilda Metro M.D.   On: 10/24/2016 21:32   Mr Cervical Spine W Wo Contrast  Result Date: 10/27/2016 CLINICAL DATA:  Initial evaluation for gait disturbance for 6 years. Recent diagnosis of multiple sclerosis. EXAM: MRI CERVICAL AND THORACIC SPINE WITHOUT AND WITH CONTRAST  TECHNIQUE: Multiplanar and multiecho pulse sequences of the cervical spine, to include the craniocervical junction and cervicothoracic junction, and thoracic spine, were obtained without and with intravenous contrast. CONTRAST:  50mL MULTIHANCE GADOBENATE DIMEGLUMINE 529 MG/ML IV SOLN COMPARISON:  Prior MRI from 10/14/2016. FINDINGS: MRI CERVICAL SPINE FINDINGS Alignment: Straightening with slight reversal of the normal cervical lordosis. Trace anterolisthesis of C3 on C4, C4 on C5, and C5 on C6. Vertebrae: Vertebral body heights maintained. No evidence for acute or chronic fracture. Signal intensity within the vertebral body bone marrow is normal. No abnormal marrow edema. No worrisome osseous lesions. Cord: Patchy T2 signal abnormality Ing seen at multiple levels within the cervical spinal cord, consistent with demyelinating disease. Patchy abnormality seen near the cervicomedullary junction (series 7, image 1). Prominent focus present at the central dorsal aspect of the cord at the level of C2-3 (series 6, image 7). Prominent lesion at the left aspect of the cord inferiorly at C3-4 (series 7, image 10). Additional small lesion at the left posterior cord at C5-6 (series 7, image 18) and C6-7 (series 7, image 23). Patchy signal abnormality within the right aspect of the cord at C7-T1. No definite abnormal enhancement to suggest active demyelination. Posterior Fossa, vertebral arteries, paraspinal tissues: Extensive signal abnormality seen throughout the visualized brain, consistent with previous identified demyelinating disease. Associated abnormal enhancement at the left cerebellar peduncle again noted. Changes are better evaluated on prior brain MRI. Paraspinous and prevertebral soft tissues within normal limits. Normal intravascular flow voids present within the vertebral arteries bilaterally. Disc levels: C2-C3: Small central disc protrusion indents the ventral thecal sac without stenosis. C3-C4: Shallow broad  posterior disc protrusion mildly indents the ventral thecal sac. No canal or foraminal stenosis. C4-C5: Broad posterior disc protrusion with a more focal central component indents and partially faces the ventral thecal sac. Protruding disc abuts the cervical spinal cord without cord flattening. Mild canal stenosis. Superimposed mild uncovertebral hypertrophy. No significant foraminal stenosis. C5-C6: Chronic diffuse degenerative disc osteophyte. Superimposed left paracentral disc protrusion indents the left ventral thecal sac. Mild flattening the left hemi cord. Resultant mild to moderate canal stenosis. Moderate left with more severe right C6 foraminal stenosis. C6-C7: Mild diffuse disc bulge with small central disc protrusion. No significant stenosis. C7-T1:  Unremarkable. MRI THORACIC SPINE FINDINGS Alignment: Vertebral bodies normally aligned with preservation of the normal thoracic kyphosis. No listhesis. Vertebrae: Vertebral body heights well maintained. No evidence for acute or chronic fracture. Few scattered benign hemangiomas noted. Signal intensity within the vertebral body bone marrow otherwise normal. No worrisome osseous lesions. Cord: Extensive patchy T2/stir signal abnormality seen throughout the thoracic spinal cord, compatible with demyelinating disease. Changes most notable at the T5-6 level (series 6, image 18). Patchy post-contrast enhancement within the left aspect of the cord at the level of T3 suspicious for active demyelination (series 12, image 12). No other definite enhancement to suggest active demyelination. Overall caliber of the thoracic spinal cord within normal limits. Paraspinal and other soft tissues: Paraspinous soft tissues within normal limits. Visualized visceral structures are  unremarkable. Disc levels: No significant degenerative changes are seen within the thoracic spine. No disc bulge or disc protrusion. No significant stenosis. IMPRESSION: MRI CERVICAL SPINE IMPRESSION: 1.  Extensive patchy signal abnormality throughout the cervical spinal cord as above, consistent with demyelinating disease. No abnormal enhancement to suggest active demyelination. 2. Degenerative disc osteophyte at C5-6 with resultant mild to moderate canal and moderate to severe bilateral C6 foraminal stenosis, right worse than left. 3. Additional small disc protrusions at C2-3, C3-4, C4-5, and C6-7 as above without significant stenosis. MRI THORACIC SPINE IMPRESSION: Extensive patchy signal abnormality throughout the thoracic spinal cord, consistent with demyelinating disease. Faint patchy enhancement within the left aspect of the cord at T3 suspicious for active demyelination. No other definite evidence for active demyelination identified. Electronically Signed   By: Rise Mu M.D.   On: 10/27/2016 02:09   Mr Thoracic Spine W Wo Contrast  Result Date: 10/27/2016 CLINICAL DATA:  Initial evaluation for gait disturbance for 6 years. Recent diagnosis of multiple sclerosis. EXAM: MRI CERVICAL AND THORACIC SPINE WITHOUT AND WITH CONTRAST TECHNIQUE: Multiplanar and multiecho pulse sequences of the cervical spine, to include the craniocervical junction and cervicothoracic junction, and thoracic spine, were obtained without and with intravenous contrast. CONTRAST:  20mL MULTIHANCE GADOBENATE DIMEGLUMINE 529 MG/ML IV SOLN COMPARISON:  Prior MRI from 10/14/2016. FINDINGS: MRI CERVICAL SPINE FINDINGS Alignment: Straightening with slight reversal of the normal cervical lordosis. Trace anterolisthesis of C3 on C4, C4 on C5, and C5 on C6. Vertebrae: Vertebral body heights maintained. No evidence for acute or chronic fracture. Signal intensity within the vertebral body bone marrow is normal. No abnormal marrow edema. No worrisome osseous lesions. Cord: Patchy T2 signal abnormality Ing seen at multiple levels within the cervical spinal cord, consistent with demyelinating disease. Patchy abnormality seen near the  cervicomedullary junction (series 7, image 1). Prominent focus present at the central dorsal aspect of the cord at the level of C2-3 (series 6, image 7). Prominent lesion at the left aspect of the cord inferiorly at C3-4 (series 7, image 10). Additional small lesion at the left posterior cord at C5-6 (series 7, image 18) and C6-7 (series 7, image 23). Patchy signal abnormality within the right aspect of the cord at C7-T1. No definite abnormal enhancement to suggest active demyelination. Posterior Fossa, vertebral arteries, paraspinal tissues: Extensive signal abnormality seen throughout the visualized brain, consistent with previous identified demyelinating disease. Associated abnormal enhancement at the left cerebellar peduncle again noted. Changes are better evaluated on prior brain MRI. Paraspinous and prevertebral soft tissues within normal limits. Normal intravascular flow voids present within the vertebral arteries bilaterally. Disc levels: C2-C3: Small central disc protrusion indents the ventral thecal sac without stenosis. C3-C4: Shallow broad posterior disc protrusion mildly indents the ventral thecal sac. No canal or foraminal stenosis. C4-C5: Broad posterior disc protrusion with a more focal central component indents and partially faces the ventral thecal sac. Protruding disc abuts the cervical spinal cord without cord flattening. Mild canal stenosis. Superimposed mild uncovertebral hypertrophy. No significant foraminal stenosis. C5-C6: Chronic diffuse degenerative disc osteophyte. Superimposed left paracentral disc protrusion indents the left ventral thecal sac. Mild flattening the left hemi cord. Resultant mild to moderate canal stenosis. Moderate left with more severe right C6 foraminal stenosis. C6-C7: Mild diffuse disc bulge with small central disc protrusion. No significant stenosis. C7-T1:  Unremarkable. MRI THORACIC SPINE FINDINGS Alignment: Vertebral bodies normally aligned with preservation of  the normal thoracic kyphosis. No listhesis. Vertebrae: Vertebral body heights well maintained. No  evidence for acute or chronic fracture. Few scattered benign hemangiomas noted. Signal intensity within the vertebral body bone marrow otherwise normal. No worrisome osseous lesions. Cord: Extensive patchy T2/stir signal abnormality seen throughout the thoracic spinal cord, compatible with demyelinating disease. Changes most notable at the T5-6 level (series 6, image 18). Patchy post-contrast enhancement within the left aspect of the cord at the level of T3 suspicious for active demyelination (series 12, image 12). No other definite enhancement to suggest active demyelination. Overall caliber of the thoracic spinal cord within normal limits. Paraspinal and other soft tissues: Paraspinous soft tissues within normal limits. Visualized visceral structures are unremarkable. Disc levels: No significant degenerative changes are seen within the thoracic spine. No disc bulge or disc protrusion. No significant stenosis. IMPRESSION: MRI CERVICAL SPINE IMPRESSION: 1. Extensive patchy signal abnormality throughout the cervical spinal cord as above, consistent with demyelinating disease. No abnormal enhancement to suggest active demyelination. 2. Degenerative disc osteophyte at C5-6 with resultant mild to moderate canal and moderate to severe bilateral C6 foraminal stenosis, right worse than left. 3. Additional small disc protrusions at C2-3, C3-4, C4-5, and C6-7 as above without significant stenosis. MRI THORACIC SPINE IMPRESSION: Extensive patchy signal abnormality throughout the thoracic spinal cord, consistent with demyelinating disease. Faint patchy enhancement within the left aspect of the cord at T3 suspicious for active demyelination. No other definite evidence for active demyelination identified. Electronically Signed   By: Rise Mu M.D.   On: 10/27/2016 02:09    Time Spent in minutes  30   Pearson Grippe M.D  on 10/28/2016 at 4:10 PM  Between 7am to 7pm - Pager - [phone]  After 7pm go to [email or web address] - password [facility]  [facility] -  Office  [phone]

## 2016-10-29 ENCOUNTER — Encounter (HOSPITAL_COMMUNITY): Payer: Self-pay

## 2016-10-29 ENCOUNTER — Inpatient Hospital Stay (HOSPITAL_COMMUNITY)
Admission: RE | Admit: 2016-10-29 | Discharge: 2016-11-09 | DRG: 059 | Disposition: A | Discharge status: Home / Self Care | Payer: Self-pay | Source: Ambulatory Visit | Attending: Physical Medicine & Rehabilitation | Admitting: Physical Medicine & Rehabilitation

## 2016-10-29 DIAGNOSIS — K5900 Constipation, unspecified: Secondary | ICD-10-CM | POA: Diagnosis present

## 2016-10-29 DIAGNOSIS — N183 Chronic kidney disease, stage 3 unspecified: Secondary | ICD-10-CM

## 2016-10-29 DIAGNOSIS — N182 Chronic kidney disease, stage 2 (mild): Secondary | ICD-10-CM

## 2016-10-29 DIAGNOSIS — G35D Multiple sclerosis, unspecified: Secondary | ICD-10-CM

## 2016-10-29 DIAGNOSIS — K592 Neurogenic bowel, not elsewhere classified: Secondary | ICD-10-CM

## 2016-10-29 DIAGNOSIS — I129 Hypertensive chronic kidney disease with stage 1 through stage 4 chronic kidney disease, or unspecified chronic kidney disease: Secondary | ICD-10-CM | POA: Diagnosis present

## 2016-10-29 DIAGNOSIS — Z8249 Family history of ischemic heart disease and other diseases of the circulatory system: Secondary | ICD-10-CM

## 2016-10-29 DIAGNOSIS — R269 Unspecified abnormalities of gait and mobility: Secondary | ICD-10-CM | POA: Diagnosis present

## 2016-10-29 DIAGNOSIS — Z6841 Body Mass Index (BMI) 40.0 and over, adult: Secondary | ICD-10-CM

## 2016-10-29 DIAGNOSIS — T380X5A Adverse effect of glucocorticoids and synthetic analogues, initial encounter: Secondary | ICD-10-CM | POA: Diagnosis present

## 2016-10-29 DIAGNOSIS — G35 Multiple sclerosis: Principal | ICD-10-CM | POA: Diagnosis present

## 2016-10-29 DIAGNOSIS — N319 Neuromuscular dysfunction of bladder, unspecified: Secondary | ICD-10-CM | POA: Diagnosis present

## 2016-10-29 DIAGNOSIS — R739 Hyperglycemia, unspecified: Secondary | ICD-10-CM

## 2016-10-29 DIAGNOSIS — M62838 Other muscle spasm: Secondary | ICD-10-CM

## 2016-10-29 DIAGNOSIS — D72829 Elevated white blood cell count, unspecified: Secondary | ICD-10-CM

## 2016-10-29 DIAGNOSIS — E46 Unspecified protein-calorie malnutrition: Secondary | ICD-10-CM

## 2016-10-29 DIAGNOSIS — I10 Essential (primary) hypertension: Secondary | ICD-10-CM

## 2016-10-29 DIAGNOSIS — D62 Acute posthemorrhagic anemia: Secondary | ICD-10-CM | POA: Diagnosis present

## 2016-10-29 DIAGNOSIS — E8809 Other disorders of plasma-protein metabolism, not elsewhere classified: Secondary | ICD-10-CM | POA: Diagnosis present

## 2016-10-29 LAB — GLUCOSE, CAPILLARY
GLUCOSE-CAPILLARY: 130 mg/dL — AB (ref 65–99)
GLUCOSE-CAPILLARY: 110 mg/dL — AB (ref 65–99)
GLUCOSE-CAPILLARY: 117 mg/dL — AB (ref 65–99)
GLUCOSE-CAPILLARY: 138 mg/dL — AB (ref 65–99)

## 2016-10-29 LAB — COMPREHENSIVE METABOLIC PANEL
ALT: 129 U/L — ABNORMAL HIGH (ref 14–54)
AST: 79 U/L — AB (ref 15–41)
Albumin: 3.4 g/dL — ABNORMAL LOW (ref 3.5–5.0)
Alkaline Phosphatase: 66 U/L (ref 38–126)
Anion gap: 9 (ref 5–15)
BILIRUBIN TOTAL: 0.6 mg/dL (ref 0.3–1.2)
BUN: 21 mg/dL — AB (ref 6–20)
CO2: 24 mmol/L (ref 22–32)
CREATININE: 1.14 mg/dL — AB (ref 0.44–1.00)
Calcium: 8.7 mg/dL — ABNORMAL LOW (ref 8.9–10.3)
Chloride: 109 mmol/L (ref 101–111)
GFR, EST NON AFRICAN AMERICAN: 55 mL/min — AB (ref >60)
Glucose, Bld: 144 mg/dL — ABNORMAL HIGH (ref 65–99)
POTASSIUM: 3.8 mmol/L (ref 3.5–5.1)
SODIUM: 142 mmol/L (ref 135–145)
TOTAL PROTEIN: 7 g/dL (ref 6.5–8.1)

## 2016-10-29 LAB — CBC
HCT: 44.4 % (ref 36.0–46.0)
Hemoglobin: 14.4 g/dL (ref 12.0–15.0)
MCH: 29.9 pg (ref 26.0–34.0)
MCHC: 32.4 g/dL (ref 30.0–36.0)
MCV: 92.1 fL (ref 78.0–100.0)
PLATELETS: 230 10*3/uL (ref 150–400)
RBC: 4.82 MIL/uL (ref 3.87–5.11)
RDW: 15.2 % (ref 11.5–15.5)
WBC: 6.5 10*3/uL (ref 4.0–10.5)

## 2016-10-29 MED ORDER — PANTOPRAZOLE SODIUM 40 MG PO TBEC
40.0000 mg | DELAYED_RELEASE_TABLET | Freq: Every day | ORAL | 2 refills | Status: DC
Start: 2016-10-29 — End: 2016-11-09

## 2016-10-29 MED ORDER — AMLODIPINE BESYLATE 10 MG PO TABS: 10.0000 mg | ORAL_TABLET | Freq: Every day | ORAL | 2 refills | Status: DC

## 2016-10-29 MED ORDER — BACLOFEN 10 MG PO TABS: 10.0000 mg | ORAL_TABLET | Freq: Three times a day (TID) | ORAL | 0 refills | Status: DC

## 2016-10-29 MED ORDER — HYDRALAZINE HCL 50 MG PO TABS: 50.0000 mg | ORAL_TABLET | Freq: Three times a day (TID) | ORAL | 2 refills | Status: DC

## 2016-10-29 NOTE — Discharge Summary (Signed)
Physician Discharge Summary  Brittany Hartman MRN: [id number] DOB/AGE: July 14, 1966 51 y.o.  PCP: No PCP Per Patient   Admit date: 10/24/2016 Discharge date: 10/29/2016  Discharge Diagnoses:    Principal Problem:   Multiple sclerosis ([place]) Active Problems:   Essential hypertension   Acute cystitis with hematuria    Follow-up recommendations Follow-up with PCP in 3-5 days , including all  additional recommended appointments as below Follow-up CBC, CMP in 3-5 days Patient would need to follow-up with outpatient neurologist for long term immunomodulatory treatment      Current Discharge Medication List    START taking these medications   Details  amLODipine (NORVASC) 10 MG tablet Take 1 tablet (10 mg total) by mouth daily. Qty: 30 tablet, Refills: 2    baclofen (LIORESAL) 10 MG tablet Take 1 tablet (10 mg total) by mouth 3 (three) times daily. Qty: 30 each, Refills: 0    hydrALAZINE (APRESOLINE) 50 MG tablet Take 1 tablet (50 mg total) by mouth 3 (three) times daily. Qty: 90 tablet, Refills: 2    pantoprazole (PROTONIX) 40 MG tablet Take 1 tablet (40 mg total) by mouth daily. Qty: 30 tablet, Refills: 2      STOP taking these medications     cephALEXin (KEFLEX) 500 MG capsule          Discharge Condition: Stable Discharge Instructions Get Medicines reviewed and adjusted: Please take all your medications with you for your next visit with your Primary MD  Please request your Primary MD to go over all hospital tests and procedure/radiological results at the follow up, please ask your Primary MD to get all Hospital records sent to his/her office.  If you experience worsening of your admission symptoms, develop shortness of breath, life threatening emergency, suicidal or homicidal thoughts you must seek medical attention immediately by calling 911 or calling your MD immediately if symptoms less severe.  You must read complete instructions/literature along with all the  possible adverse reactions/side effects for all the Medicines you take and that have been prescribed to you. Take any new Medicines after you have completely understood and accpet all the possible adverse reactions/side effects.   Do not drive when taking Pain medications.   Do not take more than prescribed Pain, Sleep and Anxiety Medications  Special Instructions: If you have smoked or chewed Tobacco in the last 2 yrs please stop smoking, stop any regular Alcohol and or any Recreational drug use.  Wear Seat belts while driving.  Please note  You were cared for by a hospitalist during your hospital stay. Once you are discharged, your primary care physician will handle any further medical issues. Please note that NO REFILLS for any discharge medications will be authorized once you are discharged, as it is imperative that you return to your primary care physician (or establish a relationship with a primary care physician if you do not have one) for your aftercare needs so that they can reassess your need for medications and monitor your lab values.  Discharge Instructions    Diet - low sodium heart healthy    Complete by:  As directed    Increase activity slowly    Complete by:  As directed        No Known Allergies    Disposition: CIR   Consults:  Neurology     Significant Diagnostic Studies:  Ct Head Wo Contrast  Result Date: 10/18/2016 CLINICAL DATA:  Lightheadedness and vertigo. EXAM: CT HEAD WITHOUT CONTRAST TECHNIQUE: Contiguous axial images  were obtained from the base of the skull through the vertex without intravenous contrast. COMPARISON:  None. FINDINGS: Brain: There is no intracranial hemorrhage, mass or evidence of acute infarction. There is mild generalized atrophy. There is mild chronic microvascular ischemic change. There is no significant extra-axial fluid collection. Benign hyperostosis calcifications incidentally noted. No acute intracranial findings are  evident. Vascular: No hyperdense vessel or unexpected calcification. Skull: Normal. Negative for fracture or focal lesion. Sinuses/Orbits: No acute finding. Other: None. IMPRESSION: No acute intracranial findings. There is mild generalized atrophy and chronic appearing white matter hypodensities which likely represent small vessel ischemic disease. Electronically Signed   By: Andreas Newport M.D.   On: 10/18/2016 23:50   Mr Jeri Cos QP Contrast  Result Date: 10/24/2016 CLINICAL DATA:  Leg stiffness, legs gave out today. Similar symptoms intermittently for 6 months. Evaluate gait abnormality. History of hypertension. EXAM: MRI HEAD WITHOUT CONTRAST TECHNIQUE: Multiplanar, multiecho pulse sequences of the brain and surrounding structures were obtained without intravenous contrast. COMPARISON:  CT HEAD October 18, 2016 FINDINGS: BRAIN: At least 6 infratentorial white matter lesions (including brainstem) infarct greater than 10 supratentorial white matter lesions which predominately radiate from the periventricular margin with low T1 signal compatible with black holes of demyelination. Multifocal T2 shine through and scattered areas of equivocal, possibly hyperacute demyelination. Faint enhancement within the LEFT superior cerebellar peduncle, RIGHT periventricular frontal lobe, RIGHT frontal lobe, rostrum of the corpus callosum and to lesser extent along the periphery of LEFT periatrial white matter lesion. 7 mm LEFT basal ganglia lesion. LEFT parietal small cortical lesion. Dominant 11 mm LEFT frontal lobe lesion. Mild parenchymal brain volume loss for age. A few scattered chronic micro hemorrhages. No abnormal extra-axial fluid collections cord enhancement. VASCULAR: Normal major intracranial vascular flow voids present at skull base. SKULL AND UPPER CERVICAL SPINE: No abnormal sellar expansion. No suspicious calvarial bone marrow signal. Craniocervical junction maintained. SINUSES/ORBITS: The mastoid air-cells and  included paranasal sinuses are well-aerated. The included ocular globes and orbital contents are non-suspicious. OTHER: None. IMPRESSION: Severe chronic supra- and infratentorial demyelination with superimposed hyperacute and acute demyelinating lesions including LEFT superior cerebellar peduncle. Mild parenchymal brain volume loss for age. Electronically Signed   By: Elon Alas M.D.   On: 10/24/2016 21:32   Mr Cervical Spine W Wo Contrast  Result Date: 10/27/2016 CLINICAL DATA:  Initial evaluation for gait disturbance for 6 years. Recent diagnosis of multiple sclerosis. EXAM: MRI CERVICAL AND THORACIC SPINE WITHOUT AND WITH CONTRAST TECHNIQUE: Multiplanar and multiecho pulse sequences of the cervical spine, to include the craniocervical junction and cervicothoracic junction, and thoracic spine, were obtained without and with intravenous contrast. CONTRAST:  42m MULTIHANCE GADOBENATE DIMEGLUMINE 529 MG/ML IV SOLN COMPARISON:  Prior MRI from 10/14/2016. FINDINGS: MRI CERVICAL SPINE FINDINGS Alignment: Straightening with slight reversal of the normal cervical lordosis. Trace anterolisthesis of C3 on C4, C4 on C5, and C5 on C6. Vertebrae: Vertebral body heights maintained. No evidence for acute or chronic fracture. Signal intensity within the vertebral body bone marrow is normal. No abnormal marrow edema. No worrisome osseous lesions. Cord: Patchy T2 signal abnormality Ing seen at multiple levels within the cervical spinal cord, consistent with demyelinating disease. Patchy abnormality seen near the cervicomedullary junction (series 7, image 1). Prominent focus present at the central dorsal aspect of the cord at the level of C2-3 (series 6, image 7). Prominent lesion at the left aspect of the cord inferiorly at C3-4 (series 7, image 10). Additional small lesion at the  left posterior cord at C5-6 (series 7, image 18) and C6-7 (series 7, image 23). Patchy signal abnormality within the right aspect of the cord  at C7-T1. No definite abnormal enhancement to suggest active demyelination. Posterior Fossa, vertebral arteries, paraspinal tissues: Extensive signal abnormality seen throughout the visualized brain, consistent with previous identified demyelinating disease. Associated abnormal enhancement at the left cerebellar peduncle again noted. Changes are better evaluated on prior brain MRI. Paraspinous and prevertebral soft tissues within normal limits. Normal intravascular flow voids present within the vertebral arteries bilaterally. Disc levels: C2-C3: Small central disc protrusion indents the ventral thecal sac without stenosis. C3-C4: Shallow broad posterior disc protrusion mildly indents the ventral thecal sac. No canal or foraminal stenosis. C4-C5: Broad posterior disc protrusion with a more focal central component indents and partially faces the ventral thecal sac. Protruding disc abuts the cervical spinal cord without cord flattening. Mild canal stenosis. Superimposed mild uncovertebral hypertrophy. No significant foraminal stenosis. C5-C6: Chronic diffuse degenerative disc osteophyte. Superimposed left paracentral disc protrusion indents the left ventral thecal sac. Mild flattening the left hemi cord. Resultant mild to moderate canal stenosis. Moderate left with more severe right C6 foraminal stenosis. C6-C7: Mild diffuse disc bulge with small central disc protrusion. No significant stenosis. C7-T1:  Unremarkable. MRI THORACIC SPINE FINDINGS Alignment: Vertebral bodies normally aligned with preservation of the normal thoracic kyphosis. No listhesis. Vertebrae: Vertebral body heights well maintained. No evidence for acute or chronic fracture. Few scattered benign hemangiomas noted. Signal intensity within the vertebral body bone marrow otherwise normal. No worrisome osseous lesions. Cord: Extensive patchy T2/stir signal abnormality seen throughout the thoracic spinal cord, compatible with demyelinating disease.  Changes most notable at the T5-6 level (series 6, image 18). Patchy post-contrast enhancement within the left aspect of the cord at the level of T3 suspicious for active demyelination (series 12, image 12). No other definite enhancement to suggest active demyelination. Overall caliber of the thoracic spinal cord within normal limits. Paraspinal and other soft tissues: Paraspinous soft tissues within normal limits. Visualized visceral structures are unremarkable. Disc levels: No significant degenerative changes are seen within the thoracic spine. No disc bulge or disc protrusion. No significant stenosis. IMPRESSION: MRI CERVICAL SPINE IMPRESSION: 1. Extensive patchy signal abnormality throughout the cervical spinal cord as above, consistent with demyelinating disease. No abnormal enhancement to suggest active demyelination. 2. Degenerative disc osteophyte at C5-6 with resultant mild to moderate canal and moderate to severe bilateral C6 foraminal stenosis, right worse than left. 3. Additional small disc protrusions at C2-3, C3-4, C4-5, and C6-7 as above without significant stenosis. MRI THORACIC SPINE IMPRESSION: Extensive patchy signal abnormality throughout the thoracic spinal cord, consistent with demyelinating disease. Faint patchy enhancement within the left aspect of the cord at T3 suspicious for active demyelination. No other definite evidence for active demyelination identified. Electronically Signed   By: Jeannine Boga M.D.   On: 10/27/2016 02:09   Mr Thoracic Spine W Wo Contrast  Result Date: 10/27/2016 CLINICAL DATA:  Initial evaluation for gait disturbance for 6 years. Recent diagnosis of multiple sclerosis. EXAM: MRI CERVICAL AND THORACIC SPINE WITHOUT AND WITH CONTRAST TECHNIQUE: Multiplanar and multiecho pulse sequences of the cervical spine, to include the craniocervical junction and cervicothoracic junction, and thoracic spine, were obtained without and with intravenous contrast. CONTRAST:   73m MULTIHANCE GADOBENATE DIMEGLUMINE 529 MG/ML IV SOLN COMPARISON:  Prior MRI from 10/14/2016. FINDINGS: MRI CERVICAL SPINE FINDINGS Alignment: Straightening with slight reversal of the normal cervical lordosis. Trace anterolisthesis of C3 on C4,  C4 on C5, and C5 on C6. Vertebrae: Vertebral body heights maintained. No evidence for acute or chronic fracture. Signal intensity within the vertebral body bone marrow is normal. No abnormal marrow edema. No worrisome osseous lesions. Cord: Patchy T2 signal abnormality Ing seen at multiple levels within the cervical spinal cord, consistent with demyelinating disease. Patchy abnormality seen near the cervicomedullary junction (series 7, image 1). Prominent focus present at the central dorsal aspect of the cord at the level of C2-3 (series 6, image 7). Prominent lesion at the left aspect of the cord inferiorly at C3-4 (series 7, image 10). Additional small lesion at the left posterior cord at C5-6 (series 7, image 18) and C6-7 (series 7, image 23). Patchy signal abnormality within the right aspect of the cord at C7-T1. No definite abnormal enhancement to suggest active demyelination. Posterior Fossa, vertebral arteries, paraspinal tissues: Extensive signal abnormality seen throughout the visualized brain, consistent with previous identified demyelinating disease. Associated abnormal enhancement at the left cerebellar peduncle again noted. Changes are better evaluated on prior brain MRI. Paraspinous and prevertebral soft tissues within normal limits. Normal intravascular flow voids present within the vertebral arteries bilaterally. Disc levels: C2-C3: Small central disc protrusion indents the ventral thecal sac without stenosis. C3-C4: Shallow broad posterior disc protrusion mildly indents the ventral thecal sac. No canal or foraminal stenosis. C4-C5: Broad posterior disc protrusion with a more focal central component indents and partially faces the ventral thecal sac.  Protruding disc abuts the cervical spinal cord without cord flattening. Mild canal stenosis. Superimposed mild uncovertebral hypertrophy. No significant foraminal stenosis. C5-C6: Chronic diffuse degenerative disc osteophyte. Superimposed left paracentral disc protrusion indents the left ventral thecal sac. Mild flattening the left hemi cord. Resultant mild to moderate canal stenosis. Moderate left with more severe right C6 foraminal stenosis. C6-C7: Mild diffuse disc bulge with small central disc protrusion. No significant stenosis. C7-T1:  Unremarkable. MRI THORACIC SPINE FINDINGS Alignment: Vertebral bodies normally aligned with preservation of the normal thoracic kyphosis. No listhesis. Vertebrae: Vertebral body heights well maintained. No evidence for acute or chronic fracture. Few scattered benign hemangiomas noted. Signal intensity within the vertebral body bone marrow otherwise normal. No worrisome osseous lesions. Cord: Extensive patchy T2/stir signal abnormality seen throughout the thoracic spinal cord, compatible with demyelinating disease. Changes most notable at the T5-6 level (series 6, image 18). Patchy post-contrast enhancement within the left aspect of the cord at the level of T3 suspicious for active demyelination (series 12, image 12). No other definite enhancement to suggest active demyelination. Overall caliber of the thoracic spinal cord within normal limits. Paraspinal and other soft tissues: Paraspinous soft tissues within normal limits. Visualized visceral structures are unremarkable. Disc levels: No significant degenerative changes are seen within the thoracic spine. No disc bulge or disc protrusion. No significant stenosis. IMPRESSION: MRI CERVICAL SPINE IMPRESSION: 1. Extensive patchy signal abnormality throughout the cervical spinal cord as above, consistent with demyelinating disease. No abnormal enhancement to suggest active demyelination. 2. Degenerative disc osteophyte at C5-6 with  resultant mild to moderate canal and moderate to severe bilateral C6 foraminal stenosis, right worse than left. 3. Additional small disc protrusions at C2-3, C3-4, C4-5, and C6-7 as above without significant stenosis. MRI THORACIC SPINE IMPRESSION: Extensive patchy signal abnormality throughout the thoracic spinal cord, consistent with demyelinating disease. Faint patchy enhancement within the left aspect of the cord at T3 suspicious for active demyelination. No other definite evidence for active demyelination identified. Electronically Signed   By: Pincus Badder.D.  On: 10/27/2016 02:09        Filed Weights   10/24/16 1610 10/25/16 0238  Weight: 102.1 kg (225 lb) 120.8 kg (266 lb 4.8 oz)     Microbiology: Recent Results (from the past 240 hour(s))  Culture, Urine     Status: None   Collection Time: 10/24/16  6:23 PM  Result Value Ref Range Status   Specimen Description URINE, RANDOM  Final   Special Requests NONE  Final   Culture NO GROWTH  Final   Report Status 10/26/2016 FINAL  Final       Blood Culture    Component Value Date/Time   SDES URINE, RANDOM 10/24/2016 1823   SPECREQUEST NONE 10/24/2016 1823   CULT NO GROWTH 10/24/2016 1823   REPTSTATUS 10/26/2016 FINAL 10/24/2016 1823      Labs: Results for orders placed or performed during the hospital encounter of 10/24/16 (from the past 48 hour(s))  Glucose, capillary     Status: Abnormal   Collection Time: 10/27/16  9:02 PM  Result Value Ref Range   Glucose-Capillary 142 (H) 65 - 99 mg/dL  CBC     Status: None   Collection Time: 10/29/16  5:24 AM  Result Value Ref Range   WBC 6.5 4.0 - 10.5 K/uL   RBC 4.82 3.87 - 5.11 MIL/uL   Hemoglobin 14.4 12.0 - 15.0 g/dL   HCT 44.4 36.0 - 46.0 %   MCV 92.1 78.0 - 100.0 fL   MCH 29.9 26.0 - 34.0 pg   MCHC 32.4 30.0 - 36.0 g/dL   RDW 15.2 11.5 - 15.5 %   Platelets 230 150 - 400 K/uL  Comprehensive metabolic panel     Status: Abnormal   Collection Time: 10/29/16   5:24 AM  Result Value Ref Range   Sodium 142 135 - 145 mmol/L   Potassium 3.8 3.5 - 5.1 mmol/L   Chloride 109 101 - 111 mmol/L   CO2 24 22 - 32 mmol/L   Glucose, Bld 144 (H) 65 - 99 mg/dL   BUN 21 (H) 6 - 20 mg/dL   Creatinine, Ser 1.14 (H) 0.44 - 1.00 mg/dL   Calcium 8.7 (L) 8.9 - 10.3 mg/dL   Total Protein 7.0 6.5 - 8.1 g/dL   Albumin 3.4 (L) 3.5 - 5.0 g/dL   AST 79 (H) 15 - 41 U/L   ALT 129 (H) 14 - 54 U/L   Alkaline Phosphatase 66 38 - 126 U/L   Total Bilirubin 0.6 0.3 - 1.2 mg/dL   GFR calc non Af Amer 55 (L) >60 mL/min   GFR calc Af Amer >60 >60 mL/min    Comment: (NOTE) The eGFR has been calculated using the CKD EPI equation. This calculation has not been validated in all clinical situations. eGFR's persistently <60 mL/min signify possible Chronic Kidney Disease.    Anion gap 9 5 - 15  Glucose, capillary     Status: Abnormal   Collection Time: 10/29/16  8:10 AM  Result Value Ref Range   Glucose-Capillary 130 (H) 65 - 99 mg/dL     Lipid Panel  No results found for: CHOL, TRIG, HDL, CHOLHDL, VLDL, LDLCALC, LDLDIRECT   Lab Results  Component Value Date   HGBA1C 5.5 10/25/2016     Lab Results  Component Value Date   CREATININE 1.14 (H) 10/29/2016     HPI   51 y.o. right handed female with history of hypertension. Presented 10/24/2016 with progressive gait dysfunction over the past 6 years and  recent increased falls. Per chart review patient lives with cousin independent prior to admission. 2 level home with bed and bath upstairs. Her cousin works during the day with family can check on her as needed. CT/MRI showed severe chronic supra-and infratentorial demyelination with superimposed hyperacute acute demyelinating lesions including left superior cerebellar peduncle. Neurology consulted placed on intravenous Solu-Medrol. MRI cervical and thoracic spine, also consistent with demyelinating process  HOSPITAL COURSE:    1. New diagnosis of Multiple sclerosiswith  exacerbation: IV Solu-Medrol 500 mg twice a day but neurology, started 4/12 around midnight( 0010 hrs ) MRI of the thoracic/cervical spine consistent with multiple sclerosis/demyelination Continue Protonix , as patient received high-dose steroids Neurology input appreciated Discussed with neurology, Etta Quill, no indication for steroid taper at this time Patient would need outpatient neurology to further discuss long-term immunomodulating   treatment  2. Hypertension:uncontrolled exacerbated by steroids Started on oralhydralazine during this admission, continue Norvasc  3. Urinary tract infection: Pt completed keflex  4. Hypokalemia Repleted  Discharge Exam:   Blood pressure (!) 160/83, pulse 69, temperature 99 F (37.2 C), temperature source Oral, resp. rate 18, height '5\' 6"'  (1.676 m), weight 120.8 kg (266 lb 4.8 oz), SpO2 100 %.    Awake Alert, Oriented X 3, No new F.N deficits, Normal affect Ollie.AT,PERRAL Supple Neck,No JVD, No cervical lymphadenopathy appriciated.  Symmetrical Chest wall movement, Good air movement bilaterally, CTAB RRR,No Gallops,Rubs or new Murmurs, No Parasternal Heave +ve B.Sounds, Abd Soft, No tenderness, No organomegaly appriciated, No rebound - guarding or rigidity. No Cyanosis, Clubbing or edema, No new Rash or bruise      Signed: Reyne Dumas 10/29/2016, 11:42 AM        Time spent >45 mins

## 2016-10-29 NOTE — Progress Notes (Signed)
Rehab admissions - I have a bed for patient on inpatient rehab today.  Once MD has cleared patient, can admit to acute inpatient rehab today.  Call me for questions.  #[phone]

## 2016-10-29 NOTE — Progress Notes (Signed)
PT Cancellation Note  Patient Details Name: Brittany Hartman MRN: [id number] DOB: 29-Sep-1965   Cancelled Treatment:    Reason Eval/Treat Not Completed: Other (comment) (Pt to d/c to CIR will defer PT needs to CIR.  )   Candia Kingsbury Artis Delay 10/29/2016, 3:53 PM Joycelyn Rua, PTA pager [phone]

## 2016-10-29 NOTE — H&P (Signed)
Physical Medicine and Rehabilitation Admission H&P    Chief Complaint  Patient presents with  . Gait Problem  : HPI: Brittany Hartman is a 51 y.o. right handed female with history of hypertension. History taken from chart review and patient. Presented 10/24/2016 with progressive gait dysfunction over the past 6 years and recent increased falls. Per chart review patient lives with cousin independent prior to admission. 2 level home with bed and bath upstairs. Her cousin works during the day with family can check on her as needed. MRI spine reviewed with patient showing C/T-spine lesions. Per report, CT/MRI showed severe chronic supra-and infratentorial demyelination with superimposed hyperacute acute demyelinating lesions including left superior cerebellar peduncle. Neurology consulted placed on intravenous Solu-MedrolAnd transitioned to prednisone 60 mg daily and tapering over 12 days. MRI cervical and thoracic spine showed extensive patchy signal abnormalities throughout the cervical and thoracic spine consistent with demyelinating disease. There was some additional small disc protrusions at C2-3, C3-4, C4-5 and C6-7 without significant stenosis. Subcutaneous Lovenox for DVT prophylaxis. Physical and occupational therapy evaluation completed with recommendations of physical medicine rehabilitation consult. Patient was admitted for a comprehensive rehabilitation program  Review of Systems  Constitutional: Negative for chills and fever.  HENT: Negative for hearing loss and tinnitus.   Eyes: Positive for blurred vision. Negative for double vision.  Respiratory: Negative for cough and shortness of breath.   Cardiovascular: Positive for leg swelling. Negative for chest pain and palpitations.  Gastrointestinal: Positive for constipation. Negative for nausea and vomiting.  Genitourinary: Positive for frequency. Negative for dysuria, flank pain and hematuria.  Musculoskeletal: Positive for falls and  myalgias.  Skin: Negative for rash.  Neurological: Positive for weakness. Negative for seizures.  All other systems reviewed and are negative.  Past Medical History:  Diagnosis Date  . Hypertension    History reviewed. No pertinent surgical history. Family History  Problem Relation Age of Onset  . Heart attack Mother   . Hypertension Brother    Social History:  reports that she has never smoked. She has never used smokeless tobacco. She reports that she does not drink alcohol. Her drug history is not on file. Allergies: No Known Allergies Medications Prior to Admission  Medication Sig Dispense Refill  . cephALEXin (KEFLEX) 500 MG capsule 2 caps po bid x 7 days (Patient taking differently: Take 1,000 mg by mouth 2 (two) times daily. 2 caps po bid x 7 days) 28 capsule 0    Home: Home Living Family/patient expects to be discharged to:: Private residence Living Arrangements:  (lives with cousin for past month) Available Help at Discharge: Family, Available PRN/intermittently Type of Home: Other(Comment) (townhome) Home Access: Stairs to enter Secretary/administrator of Steps: 1 Home Layout: Two level, 1/2 bath on main level, Bed/bath upstairs Alternate Level Stairs-Number of Steps: 4 Alternate Level Stairs-Rails: Right, Left Bathroom Shower/Tub: Tub/shower unit, Buyer, retail: Yes Home Equipment: None  Lives With: Family   Functional History: Prior Function Level of Independence: Independent Comments: recent loss of job she had for one month due to fall at work  Functional Status:  Mobility: Bed Mobility Overal bed mobility: Needs Assistance Bed Mobility: Supine to Sit, Sit to Supine Supine to sit: Min assist, HOB elevated Sit to supine: Mod assist, HOB elevated General bed mobility comments: used rail and elevated HOB to assist Transfers Overall transfer level: Needs assistance Equipment used: Rolling walker (2  wheeled) Transfers: Sit to/from Stand Sit to Stand: Mod assist General transfer  comment: Repeated verbal cues for hand placement.  Patient continued to reach for RW to pull self up to standing.  Required mod assist to power up to standing x3.  Decreased balance in stance, requiring min guard to min assist for static standing balance. Ambulation/Gait Ambulation/Gait assistance: Mod assist, +2 safety/equipment Ambulation Distance (Feet): 60 Feet (60' x2) Assistive device: Rolling walker (2 wheeled) Gait Pattern/deviations: Step-through pattern, Decreased stride length, Decreased weight shift to left, Ataxic, Staggering left, Staggering right, Trunk flexed, Wide base of support, Shuffle General Gait Details: Verbal cues for safe use of RW and to stand upright.  Patient requires assist to maneuver RW - running into wall/door jams.  During turns, moves RW in uncontrolled manner.  Difficulty keeping feet inside RW.  Patient with very ataxic, uncontrolled gait with poor balance.  High fall risk. Gait velocity: Quick, impulsive    ADL:    Cognition: Cognition Overall Cognitive Status: No family/caregiver present to determine baseline cognitive functioning Orientation Level: Oriented X4 Cognition Arousal/Alertness: Awake/alert Behavior During Therapy: Impulsive Overall Cognitive Status: No family/caregiver present to determine baseline cognitive functioning General Comments: Quick movements, not safe, very impulsive.  Difficulty following directions to improve safety.  Physical Exam: Blood pressure (!) 160/83, pulse 69, temperature 99 F (37.2 C), temperature source Oral, resp. rate 18, height 5\' 6"  (1.676 m), weight 120.8 kg (266 lb 4.8 oz), SpO2 100 %. Physical Exam  Vitals reviewed. Constitutional: She is oriented to person, place, and time. She appears well-developed.  Obese  HENT:  Head: Normocephalic and atraumatic.  Eyes: Conjunctivae and EOM are normal. Left eye exhibits no  discharge.  Neck: Normal range of motion. Neck supple. No thyromegaly present.  Cardiovascular: Normal rate and regular rhythm.   Respiratory: Effort normal and breath sounds normal. No respiratory distress.  GI: Soft. Bowel sounds are normal. She exhibits no distension.  Musculoskeletal: She exhibits edema. She exhibits no tenderness.  Neurological: She is alert and oriented to person, place, and time.  Motor: 5/5 throughout Sensation intact to light touch DTRs symmetric  Skin: Skin is warm and dry.  Psychiatric: She has a normal mood and affect. Her behavior is normal.    Results for orders placed or performed during the hospital encounter of 10/24/16 (from the past 48 hour(s))  Glucose, capillary     Status: Abnormal   Collection Time: 10/27/16  9:02 PM  Result Value Ref Range   Glucose-Capillary 142 (H) 65 - 99 mg/dL  CBC     Status: None   Collection Time: 10/29/16  5:24 AM  Result Value Ref Range   WBC 6.5 4.0 - 10.5 K/uL   RBC 4.82 3.87 - 5.11 MIL/uL   Hemoglobin 14.4 12.0 - 15.0 g/dL   HCT 16.1 09.6 - 04.5 %   MCV 92.1 78.0 - 100.0 fL   MCH 29.9 26.0 - 34.0 pg   MCHC 32.4 30.0 - 36.0 g/dL   RDW 40.9 81.1 - 91.4 %   Platelets 230 150 - 400 K/uL   No results found.     Medical Problem List and Plan: 1.  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 Continue prednisone 60 mg daily and taper over 12 days 2.  DVT Prophylaxis/Anticoagulation: Subcutaneous Lovenox. Monitor for any bleeding episodes. Check vascular study 3. Pain Management: Baclofen 10 mg twice a day 4. Mood: Provide emotional support 5. Neuropsych: This patient is capable of making decisions on her own behalf. 6. Skin/Wound Care: Routine skin checks 7. Fluids/Electrolytes/Nutrition:  Routine I&O with follow-up chemistries 8. Neurogenic bowel and bladder. Check PVR 3. Adjust bowel program 9. Hypertension. Norvasc 10 mg daily, hydralazine 50 mg 3 times a day. Monitor  with increased mobility 10. CKD: Follow BMP 11. Steroid induced hyperglycemia: SSI   Post Admission Physician Evaluation: 1. Preadmission assessment reviewed and changes made below. 2. Functional deficits secondary  to MS. 3. Patient is admitted to receive collaborative, interdisciplinary care between the physiatrist, rehab nursing staff, and therapy team. 4. Patient's level of medical complexity and substantial therapy needs in context of that medical necessity cannot be provided at a lesser intensity of care such as a SNF. 5. Patient has experienced substantial functional loss from his/her baseline which was documented above under the "Functional History" and "Functional Status" headings.  Judging by the patient's diagnosis, physical exam, and functional history, the patient has potential for functional progress which will result in measurable gains while on inpatient rehab.  These gains will be of substantial and practical use upon discharge  in facilitating mobility and self-care at the household level. 6. Physiatrist will provide 24 hour management of medical needs as well as oversight of the therapy plan/treatment and provide guidance as appropriate regarding the interaction of the two. 7. The Preadmission Screening has been reviewed and patient status is unchanged unless otherwise stated above. 8. 24 hour rehab nursing will assist with bladder management, safety, disease management and patient education  and help integrate therapy concepts, techniques,education, etc. 9. PT will assess and treat for/with: Lower extremity strength, range of motion, stamina, balance, functional mobility, safety, adaptive techniques and equipment,  coping skills, pain control, education.   Goals are: Mod I. 10. OT will assess and treat for/with: ADL's, functional mobility, safety, upper extremity strength, adaptive techniques and equipment, ego support, and community reintegration.   Goals are: Mod I. Therapy may  proceed with showering this patient. 11. Case Management and Social Worker will assess and treat for psychological issues and discharge planning. 12. Team conference will be held weekly to assess progress toward goals and to determine barriers to discharge. 13. Patient will receive at least 3 hours of therapy per day at least 5 days per week. 14. ELOS: 5-8 days.       15. Prognosis:  good  Maryla Morrow, MD, [place] [facility] Charlton Amor., PA-C 10/29/2016

## 2016-10-29 NOTE — Progress Notes (Signed)
Brittany Oyster, MD Physician Signed Physical Medicine and Rehabilitation  Consult Note Date of Service: 10/26/2016 8:52 AM  Related encounter: ED to Hosp-Admission (Current) from 10/24/2016 in [facility] MEDICAL     Expand All Collapse All   [] Hide copied text [] Hover for attribution information      Physical Medicine and Rehabilitation Consult Reason for Consult: Gait disorder Referring Physician: [facility]   HPI: Brittany Hartman is a 51 y.o. right handed female with history of hypertension. Presented 10/24/2016 with progressive gait dysfunction over the past 6 years and recent increased falls. Per chart review patient lives with cousin independent prior to admission. 2 level home with bed and bath upstairs. Her cousin works during the day with family can check on her as needed. CT/MRI showed severe chronic supra-and infratentorial demyelination with superimposed hyperacute acute demyelinating lesions including left superior cerebellar peduncle. Neurology consulted placed on intravenous Solu-Medrol. MRI cervical and thoracic spine are pending. Subcutaneous Lovenox for DVT prophylaxis. Physical therapy evaluation completed with recommendations of physical medicine rehabilitation consult.   Review of Systems  Constitutional: Negative for chills and fever.  Eyes: Positive for blurred vision. Negative for double vision.  Respiratory: Negative for cough and shortness of breath.   Cardiovascular: Negative for chest pain, palpitations and leg swelling.  Gastrointestinal: Positive for constipation. Negative for nausea and vomiting.  Genitourinary: Negative for dysuria, flank pain and hematuria.  Musculoskeletal: Positive for falls and myalgias.  Skin: Negative for rash.  Neurological: Positive for sensory change and weakness. Negative for seizures.  All other systems reviewed and are negative.      Past Medical History:  Diagnosis Date  . Hypertension     History reviewed. No pertinent surgical history.      Family History  Problem Relation Age of Onset  . Heart attack Mother   . Hypertension Brother    Social History:  reports that she has never smoked. She has never used smokeless tobacco. She reports that she does not drink alcohol. Her drug history is not on file. Allergies: No Known Allergies       Medications Prior to Admission  Medication Sig Dispense Refill  . cephALEXin (KEFLEX) 500 MG capsule 2 caps po bid x 7 days (Patient taking differently: Take 1,000 mg by mouth 2 (two) times daily. 2 caps po bid x 7 days) 28 capsule 0    Home: Home Living Family/patient expects to be discharged to:: Private residence Living Arrangements: Other relatives (cousin) Available Help at Discharge: Family, Available PRN/intermittently Type of Home: Other(Comment) (townhouse) Home Access: Stairs to enter Secretary/administrator of Steps: 1 Home Layout: Two level, Bed/bath upstairs Alternate Level Stairs-Number of Steps: 4 Alternate Level Stairs-Rails: Right, Left Bathroom Shower/Tub: Tub/shower unit Home Equipment: None  Functional History: Prior Function Level of Independence: Independent Functional Status:  Mobility: Bed Mobility Overal bed mobility: Needs Assistance Bed Mobility: Sit to Supine Sit to supine: Max assist General bed mobility comments: assist for legs into bed, then to adjust trunk, but pt able to pull herself up using headboard Transfers Overall transfer level: Needs assistance Equipment used: None Transfers: Sit to/from Stand Sit to Stand: Max assist General transfer comment: in room on EOB with nursing when I entered and pt standing up unaided, I supported her once up due to severe LOB; sat with uncontrolled descent on EOB near edge, assist to stand and reposition for safety   Assist down to toilet again with cues and assist due to uncontrolled descent Ambulation/Gait  Ambulation/Gait assistance: Mod  assist, Max assist Ambulation Distance (Feet): 12 Feet (x 2) Assistive device: Rolling walker (2 wheeled) Gait Pattern/deviations: Step-to pattern, Decreased stride length, Decreased weight shift to left, Wide base of support, Ataxic, Shuffle General Gait Details: heavy lean to R, assist to unweight R to allow swing, pt reported R LE giving out on her once close to bed; assist for walker management due to tilting walker to R with R lateral lean; also trouble accessing bathroom safely due to needing to side step and max cues and assist for safety  ADL:  Cognition: Cognition Overall Cognitive Status: No family/caregiver present to determine baseline cognitive functioning Orientation Level: Oriented X4 Cognition Arousal/Alertness: Awake/alert Behavior During Therapy: Impulsive Overall Cognitive Status: No family/caregiver present to determine baseline cognitive functioning  Blood pressure (!) 156/81, pulse 96, temperature 98.8 F (37.1 C), resp. rate 16, height 5\' 6"  (1.676 m), weight 120.8 kg (266 lb 4.8 oz), SpO2 96 %. Physical Exam  Vitals reviewed. Constitutional: She is oriented to person, place, and time.  HENT:  Head: Normocephalic.  Eyes: EOM are normal. Left eye exhibits no discharge.  Neck: Normal range of motion. Neck supple. No thyromegaly present.  Cardiovascular: Normal rate and regular rhythm.   Respiratory: Effort normal and breath sounds normal. No respiratory distress.  GI: Soft. Bowel sounds are normal. She exhibits no distension.  Neurological: She is alert and oriented to person, place, and time.  Good sitting balance.  5/5 deltoid, 5/5 bicep, 5/5 tricep, 5/5 wrist extension, 5/5 hand intrinsics.      5/5 hip flexor, 5/5 knee extension, 5/5 ankle dorsiflexion, 5/5 ankle plantaflexion. ?mild decrease LT RUE   Skin: Skin is warm and dry.  Psychiatric: She has a normal mood and affect. Her behavior is normal. Judgment and thought content normal.    Lab Results  Last 24 Hours       Results for orders placed or performed during the hospital encounter of 10/24/16 (from the past 24 hour(s))  Glucose, capillary     Status: Abnormal   Collection Time: 10/25/16 12:17 PM  Result Value Ref Range   Glucose-Capillary 238 (H) 65 - 99 mg/dL  Hemoglobin N8G     Status: None   Collection Time: 10/25/16  1:31 PM  Result Value Ref Range   Hgb A1c MFr Bld 5.5 4.8 - 5.6 %   Mean Plasma Glucose 111 mg/dL  Glucose, capillary     Status: Abnormal   Collection Time: 10/25/16  5:12 PM  Result Value Ref Range   Glucose-Capillary 163 (H) 65 - 99 mg/dL  Basic metabolic panel     Status: Abnormal   Collection Time: 10/26/16  5:19 AM  Result Value Ref Range   Sodium 141 135 - 145 mmol/L   Potassium 3.2 (L) 3.5 - 5.1 mmol/L   Chloride 108 101 - 111 mmol/L   CO2 23 22 - 32 mmol/L   Glucose, Bld 160 (H) 65 - 99 mg/dL   BUN 15 6 - 20 mg/dL   Creatinine, Ser 9.56 (H) 0.44 - 1.00 mg/dL   Calcium 9.2 8.9 - 21.3 mg/dL   GFR calc non Af Amer >60 >60 mL/min   GFR calc Af Amer >60 >60 mL/min   Anion gap 10 5 - 15      Imaging Results (Last 48 hours)  Mr Laqueta Jean Wo Contrast  Result Date: 10/24/2016 CLINICAL DATA:  Leg stiffness, legs gave out today. Similar symptoms intermittently for 6 months. Evaluate gait abnormality.  History of hypertension. EXAM: MRI HEAD WITHOUT CONTRAST TECHNIQUE: Multiplanar, multiecho pulse sequences of the brain and surrounding structures were obtained without intravenous contrast. COMPARISON:  CT HEAD October 18, 2016 FINDINGS: BRAIN: At least 6 infratentorial white matter lesions (including brainstem) infarct greater than 10 supratentorial white matter lesions which predominately radiate from the periventricular margin with low T1 signal compatible with black holes of demyelination. Multifocal T2 shine through and scattered areas of equivocal, possibly hyperacute demyelination. Faint enhancement within the LEFT superior cerebellar  peduncle, RIGHT periventricular frontal lobe, RIGHT frontal lobe, rostrum of the corpus callosum and to lesser extent along the periphery of LEFT periatrial white matter lesion. 7 mm LEFT basal ganglia lesion. LEFT parietal small cortical lesion. Dominant 11 mm LEFT frontal lobe lesion. Mild parenchymal brain volume loss for age. A few scattered chronic micro hemorrhages. No abnormal extra-axial fluid collections cord enhancement. VASCULAR: Normal major intracranial vascular flow voids present at skull base. SKULL AND UPPER CERVICAL SPINE: No abnormal sellar expansion. No suspicious calvarial bone marrow signal. Craniocervical junction maintained. SINUSES/ORBITS: The mastoid air-cells and included paranasal sinuses are well-aerated. The included ocular globes and orbital contents are non-suspicious. OTHER: None. IMPRESSION: Severe chronic supra- and infratentorial demyelination with superimposed hyperacute and acute demyelinating lesions including LEFT superior cerebellar peduncle. Mild parenchymal brain volume loss for age. Electronically Signed   By: Awilda Metro M.D.   On: 10/24/2016 21:32     Assessment/Plan: Diagnosis: Newly diagnosed MS (although has had chronic symptoms) 1. Does the need for close, 24 hr/day medical supervision in concert with the patient's rehab needs make it unreasonable for this patient to be served in a less intensive setting? Yes 2. Co-Morbidities requiring supervision/potential complications: HTN 3. Due to bladder management, bowel management, safety, skin/wound care, disease management, medication administration, pain management and patient education, does the patient require 24 hr/day rehab nursing? Yes and Potentially 4. Does the patient require coordinated care of a physician, rehab nurse, PT (1-2 hrs/day, 5 days/week) and OT (1-2 hrs/day, 5 days/week) to address physical and functional deficits in the context of the above medical diagnosis(es)? Yes and  Potentially Addressing deficits in the following areas: balance, endurance, locomotion, strength, transferring, bowel/bladder control, bathing, dressing, feeding, grooming, toileting and psychosocial support 5. Can the patient actively participate in an intensive therapy program of at least 3 hrs of therapy per day at least 5 days per week? Yes and Potentially 6. The potential for patient to make measurable gains while on inpatient rehab is good and fair 7. Anticipated functional outcomes upon discharge from inpatient rehab are modified independent  with PT, modified independent with OT, n/a with SLP. 8. Estimated rehab length of stay to reach the above functional goals is: see below 9. Does the patient have adequate social supports and living environment to accommodate these discharge functional goals? Yes and Potentially 10. Anticipated D/C setting: Home 11. Anticipated post D/C treatments: HH therapy 12. Overall Rehab/Functional Prognosis: excellent  RECOMMENDATIONS: This patient's condition is appropriate for continued rehabilitative care in the following setting: Pt has shown neurological improvement with steroids. She feels that she's close to her baseline, however. Would like to see how she performs with therapy today. If she has persistent deficits which would preclude her from returning home safely (alone during the day), then we could pursue a brief CIR admission.  Patient has agreed to participate in recommended program. Yes Note that insurance prior authorization may be required for reimbursement for recommended care.  Comment: Rehab Admissions Coordinator to  follow up.  Thanks,  Brittany Oyster, MD, [place] [facility]    Charlton Amor., PA-C 10/26/2016    Revision History                        Routing History

## 2016-10-29 NOTE — Progress Notes (Signed)
OT Cancellation Note  Patient Details Name: Lafrance Schwickerath MRN: [id number] DOB: [birth year]   Cancelled Treatment:    Reason Eval/Treat Not Completed: Other (comment). Pt DC to CIR today. Will defer OT to CIR.   [facility] Zayana Salvador, OT/L  [phone] 10/29/2016 10/29/2016, 1:12 PM

## 2016-10-29 NOTE — Progress Notes (Signed)
Trish Mage, RN Rehab Admission Coordinator Signed Physical Medicine and Rehabilitation  PMR Pre-admission Date of Service: 10/26/2016 5:35 PM  Related encounter: ED to Hosp-Admission (Current) from 10/24/2016 in [facility] 5W MEDICAL       [] Hide copied text PMR Admission Coordinator Pre-Admission Assessment  Patient: Brittany Hartman is an 51 y.o., female MRN: [id number] DOB: [birth year] Height: 5\' 6"  (167.6 cm) Weight: 120.8 kg (266 lb 4.8 oz)                                                                                                                                                  Insurance Information  PRIMARY: uninsured        Medicaid Application Date:       Case Manager:  Disability Application Date:       Case Worker:   Emergency Actuary Information    Name Relation Home Work Mobile   McBeth,Lynette  [phone]  [phone]     Current Medical History  Patient Admitting Diagnosis:  New diagnosed MS  History of Present Illness:  HPI: Shareka McKinnonis a 51 y.o.right handed femalewith history of hypertension. Presented 10/24/2016 with progressive gait dysfunction over the past 6 years and recent increased falls. CT/MRI showed severe chronic supra-and infratentorial demyelination with superimposed hyperacute acute demyelinating lesions including left superior cerebellar peduncle. Neurology consulted placed on intravenous Solu-Medrol. MRI cervical and thoracic spine are pending. Subcutaneous Lovenox for DVT prophylaxis.   Past Medical History      Past Medical History:  Diagnosis Date  . Hypertension     Family History  family history includes Heart attack in her mother; Hypertension in her brother.  Prior Rehab/Hospitalizations:  Has the patient had major surgery during 100 days prior to admission? No  Current Medications   Current Facility-Administered Medications:  .  acetaminophen  (TYLENOL) tablet 650 mg, 650 mg, Oral, Q6H PRN **OR** acetaminophen (TYLENOL) suppository 650 mg, 650 mg, Rectal, Q6H PRN, Alberteen Sam, MD .  amLODipine (NORVASC) tablet 10 mg, 10 mg, Oral, Daily, Richarda Overlie, MD, 10 mg at 10/28/16 0910 .  baclofen (LIORESAL) tablet 10 mg, 10 mg, Oral, BID, Weston Settle, MD, 10 mg at 10/28/16 2145 .  enoxaparin (LOVENOX) injection 40 mg, 40 mg, Subcutaneous, Q24H, Alberteen Sam, MD, 40 mg at 10/28/16 0909 .  hydrALAZINE (APRESOLINE) injection 10 mg, 10 mg, Intravenous, Q4H PRN, Richarda Overlie, MD, 10 mg at 10/26/16 0526 .  hydrALAZINE (APRESOLINE) tablet 50 mg, 50 mg, Oral, TID, Richarda Overlie, MD, 50 mg at 10/28/16 2145 .  pantoprazole (PROTONIX) EC tablet 40 mg, 40 mg, Oral, Daily, Richarda Overlie, MD, 40 mg at 10/28/16 0910 .  predniSONE (DELTASONE) tablet 60 mg, 60 mg, Oral, QPM, Pearson Grippe, MD, 60 mg at 10/28/16 1812  Patients Current Diet:  Diet regular Room service appropriate? Yes; Fluid consistency: Thin  Precautions / Restrictions Precautions Precautions: Fall Restrictions Weight Bearing Restrictions: No   Has the patient had 2 or more falls or a fall with injury in the past year?Yes  Prior Activity Level Noted decline in function over past 6 years, legs stiffening up.  Home Assistive Devices / Equipment Home Assistive Devices/Equipment: None Home Equipment: None  Prior Device Use: Indicate devices/aids used by the patient prior to current illness, exacerbation or injury? None of the above  Prior Functional Level Prior Function Level of Independence: Independent Comments: recent loss of job she had for one month due to fall at work  Self Care: Did the patient need help bathing, dressing, using the toilet or eating?  Independent  Indoor Mobility: Did the patient need assistance with walking from room to room (with or without device)? Independent  Stairs: Did the patient need assistance with internal or external  stairs (with or without device)? Needed some help  Functional Cognition: Did the patient need help planning regular tasks such as shopping or remembering to take medications? Needed some help  Current Functional Level Cognition  Overall Cognitive Status: No family/caregiver present to determine baseline cognitive functioning Orientation Level: Oriented X4 General Comments: Quick movements, not safe, very impulsive.  Difficulty following directions to improve safety.    Extremity Assessment (includes Sensation/Coordination)  Upper Extremity Assessment: Generalized weakness  Lower Extremity Assessment: Generalized weakness    ADLs       Mobility  Overal bed mobility: Needs Assistance Bed Mobility: Supine to Sit, Sit to Supine Supine to sit: Min assist, HOB elevated Sit to supine: Mod assist, HOB elevated General bed mobility comments: used rail and elevated HOB to assist    Transfers  Overall transfer level: Needs assistance Equipment used: Rolling walker (2 wheeled) Transfers: Sit to/from Stand Sit to Stand: Mod assist General transfer comment: Repeated verbal cues for hand placement.  Patient continued to reach for RW to pull self up to standing.  Required mod assist to power up to standing x3.  Decreased balance in stance, requiring min guard to min assist for static standing balance.    Ambulation / Gait / Stairs / Wheelchair Mobility  Ambulation/Gait Ambulation/Gait assistance: Mod assist, +2 safety/equipment Ambulation Distance (Feet): 60 Feet (60' x2) Assistive device: Rolling walker (2 wheeled) Gait Pattern/deviations: Step-through pattern, Decreased stride length, Decreased weight shift to left, Ataxic, Staggering left, Staggering right, Trunk flexed, Wide base of support, Shuffle General Gait Details: Verbal cues for safe use of RW and to stand upright.  Patient requires assist to maneuver RW - running into wall/door jams.  During turns, moves RW in  uncontrolled manner.  Difficulty keeping feet inside RW.  Patient with very ataxic, uncontrolled gait with poor balance.  High fall risk. Gait velocity: Quick, impulsive    Posture / Balance Dynamic Sitting Balance Sitting balance - Comments: seated EOB leans back due to on edge Balance Overall balance assessment: Needs assistance Sitting balance-Leahy Scale: Fair Sitting balance - Comments: seated EOB leans back due to on edge Standing balance support: Bilateral upper extremity supported Standing balance-Leahy Scale: Poor Standing balance comment: Decreased balance in stance today.      Special needs/care consideration BiPAP/CPAP  N/a CPM  N/a Continuous Drip IV  N/a Dialysis  N/a Life Vest  N/a Oxygen  N/a Special Bed  N/a Trach Size  N/a Wound Vac (area)  N/a Skin No  Bowel mgmt: Last BM 10/25/16 Bladder mgmt: WDL Diabetic mgmt Hgb A1C 5.5, no history of diabetes   Previous Home Environment Living Arrangements:  (lives with cousin for past month)  Lives With: Family Available Help at Discharge: Family, Available PRN/intermittently Type of Home: Other(Comment) (townhome) Home Layout: Two level, 1/2 bath on main level, Bed/bath upstairs Alternate Level Stairs-Rails: Right, Left Alternate Level Stairs-Number of Steps: 4 Home Access: Stairs to enter [employer] of Steps: 1 Bathroom Shower/Tub: Tub/shower unit, Engineer, building services: Standard Bathroom Accessibility: Yes How Accessible: Accessible via walker Home Care Services: No  Discharge Living Setting Plans for Discharge Living Setting: Lives with (comment) (pt plans to return to [place] to live with Uncle) Type of Home at Discharge: House Discharge Home Layout: One level Discharge Home Access: Stairs to enter Entrance Stairs-Rails: None Entrance Stairs-Number of Steps: 3 to 4 Discharge Bathroom Shower/Tub: Tub/shower unit, Curtain Discharge Bathroom Toilet:  Standard Discharge Bathroom Accessibility: Yes How Accessible: Accessible via walker Does the patient have any problems obtaining your medications?: Yes (Describe) (no insurance)  Paediatric nurse Information: Psychologist, prison and probation services, cousin Anticipated Caregiver: Uncle she plans to d/c home with Anticipated Caregiver's Contact Information: tbd Ability/Limitations of Caregiver: Uncle can provide supervision only Caregiver Availability: 24/7 Discharge Plan Discussed with Primary Caregiver: Yes Is Caregiver In Agreement with Plan?: Yes Does Caregiver/Family have Issues with Lodging/Transportation while Pt is in Rehab?: No  Goals/Additional Needs Patient/Family Goal for Rehab: Mod I with PT and OT Expected length of stay: ELOS 3- 5 days Special Service Needs: pt recent move to [place] for one month, will plan to return to [place] to Uncle's home Pt/Family Agrees to Admission and willing to participate: Yes Program Orientation Provided & Reviewed with Pt/Caregiver Including Roles  & Responsibilities: Yes  Decrease burden of Care through IP rehab admission: n/a  Possible need for SNF placement upon discharge: not anticipated  Patient Condition: This patient's medical and functional status has changed since the consult dated 10/26/2016 in which the Rehabilitation Physician determined and documented that the patient was potentially appropriate for intensive rehabilitative care in an inpatient rehabilitation facility. Issues have been addressed and update has been discussed with Dr. Riley Kill and patient now appropriate for inpatient rehabilitation.  Patient currently requiring mod assist +2 to ambulate 60 feet X 2 RW.   Will admit to inpatient rehab today.   Preadmission Screen Completed By:  Trish Mage, 10/29/2016 11:34 AM ______________________________________________________________________   Discussed status with Dr. Allena Katz on 10/29/16 at 1133 and received telephone approval for  admission today.  Admission Coordinator:  Trish Mage, time 1133/Date 10/29/16       Cosigned by: Ankit Karis Juba, MD at 10/29/2016 12:48 PM  Revision History

## 2016-10-30 ENCOUNTER — Inpatient Hospital Stay (HOSPITAL_COMMUNITY): Payer: Self-pay

## 2016-10-30 ENCOUNTER — Encounter (HOSPITAL_COMMUNITY): Payer: Self-pay

## 2016-10-30 ENCOUNTER — Inpatient Hospital Stay (HOSPITAL_COMMUNITY): Payer: Self-pay | Admitting: Occupational Therapy

## 2016-10-30 ENCOUNTER — Inpatient Hospital Stay (HOSPITAL_COMMUNITY): Payer: Self-pay | Admitting: Physical Therapy

## 2016-10-30 DIAGNOSIS — E46 Unspecified protein-calorie malnutrition: Secondary | ICD-10-CM

## 2016-10-30 DIAGNOSIS — N319 Neuromuscular dysfunction of bladder, unspecified: Secondary | ICD-10-CM

## 2016-10-30 DIAGNOSIS — K592 Neurogenic bowel, not elsewhere classified: Secondary | ICD-10-CM

## 2016-10-30 DIAGNOSIS — T380X5A Adverse effect of glucocorticoids and synthetic analogues, initial encounter: Secondary | ICD-10-CM

## 2016-10-30 DIAGNOSIS — M7989 Other specified soft tissue disorders: Secondary | ICD-10-CM

## 2016-10-30 DIAGNOSIS — I10 Essential (primary) hypertension: Secondary | ICD-10-CM

## 2016-10-30 DIAGNOSIS — R739 Hyperglycemia, unspecified: Secondary | ICD-10-CM

## 2016-10-30 DIAGNOSIS — G35 Multiple sclerosis: Principal | ICD-10-CM

## 2016-10-30 DIAGNOSIS — N182 Chronic kidney disease, stage 2 (mild): Secondary | ICD-10-CM

## 2016-10-30 LAB — GLUCOSE, CAPILLARY
GLUCOSE-CAPILLARY: 116 mg/dL — AB (ref 65–99)
GLUCOSE-CAPILLARY: 89 mg/dL (ref 65–99)
Glucose-Capillary: 110 mg/dL — ABNORMAL HIGH (ref 65–99)

## 2016-10-30 LAB — COMPREHENSIVE METABOLIC PANEL
ALK PHOS: 66 U/L (ref 38–126)
ALT: 345 U/L — AB (ref 14–54)
AST: 158 U/L — AB (ref 15–41)
Albumin: 3.1 g/dL — ABNORMAL LOW (ref 3.5–5.0)
Anion gap: 9 (ref 5–15)
BUN: 21 mg/dL — AB (ref 6–20)
CALCIUM: 8.6 mg/dL — AB (ref 8.9–10.3)
CHLORIDE: 109 mmol/L (ref 101–111)
CO2: 24 mmol/L (ref 22–32)
CREATININE: 1 mg/dL (ref 0.44–1.00)
GFR calc Af Amer: >60 mL/min (ref >60)
GFR calc non Af Amer: >60 mL/min (ref >60)
Glucose, Bld: 123 mg/dL — ABNORMAL HIGH (ref 65–99)
Potassium: 3.7 mmol/L (ref 3.5–5.1)
SODIUM: 142 mmol/L (ref 135–145)
Total Bilirubin: 0.9 mg/dL (ref 0.3–1.2)
Total Protein: 6.3 g/dL — ABNORMAL LOW (ref 6.5–8.1)

## 2016-10-30 LAB — CBC WITH DIFFERENTIAL/PLATELET
BASOS ABS: 0 10*3/uL (ref 0.0–0.1)
Basophils Relative: 0 %
Eosinophils Absolute: 0 10*3/uL (ref 0.0–0.7)
Eosinophils Relative: 0 %
HCT: 42 % (ref 36.0–46.0)
HEMOGLOBIN: 13.8 g/dL (ref 12.0–15.0)
LYMPHS ABS: 0.9 10*3/uL (ref 0.7–4.0)
LYMPHS PCT: 15 %
MCH: 29.9 pg (ref 26.0–34.0)
MCHC: 32.9 g/dL (ref 30.0–36.0)
MCV: 91.1 fL (ref 78.0–100.0)
Monocytes Absolute: 0.4 10*3/uL (ref 0.1–1.0)
Monocytes Relative: 6 %
NEUTROS PCT: 79 %
Neutro Abs: 4.6 10*3/uL (ref 1.7–7.7)
PLATELETS: 213 10*3/uL (ref 150–400)
RBC: 4.61 MIL/uL (ref 3.87–5.11)
RDW: 15 % (ref 11.5–15.5)
WBC: 5.8 10*3/uL (ref 4.0–10.5)

## 2016-10-30 MED ORDER — HYDRALAZINE HCL 50 MG PO TABS
50.0000 mg | ORAL_TABLET | Freq: Three times a day (TID) | ORAL | Status: DC
  Administered 2016-10-30 (×3): 50 mg via ORAL
  Filled 2016-10-30 (×4): qty 1

## 2016-10-30 MED ORDER — ENOXAPARIN SODIUM 40 MG/0.4ML ~~LOC~~ SOLN: 40.0000 mg | SUBCUTANEOUS | Status: DC

## 2016-10-30 MED ORDER — ONDANSETRON HCL 4 MG/2ML IJ SOLN: 4.0000 mg | Freq: Four times a day (QID) | INTRAMUSCULAR | Status: DC | PRN

## 2016-10-30 MED ORDER — SORBITOL 70 % SOLN
30.0000 mL | Freq: Every day | Status: DC | PRN
  Filled 2016-10-30 (×4): qty 30

## 2016-10-30 MED ORDER — PRO-STAT SUGAR FREE PO LIQD
30.0000 mL | Freq: Two times a day (BID) | ORAL | Status: DC
  Administered 2016-10-30 – 2016-11-09 (×18): 30 mL via ORAL
  Filled 2016-10-30 (×20): qty 30

## 2016-10-30 MED ORDER — BACLOFEN 10 MG PO TABS
10.0000 mg | ORAL_TABLET | Freq: Two times a day (BID) | ORAL | Status: DC
  Administered 2016-10-30 – 2016-11-09 (×21): 10 mg via ORAL
  Filled 2016-10-30 (×21): qty 1

## 2016-10-30 MED ORDER — ACETAMINOPHEN 650 MG RE SUPP: 650.0000 mg | Freq: Four times a day (QID) | RECTAL | Status: DC | PRN

## 2016-10-30 MED ORDER — PREDNISONE 20 MG PO TABS
60.0000 mg | ORAL_TABLET | Freq: Every evening | ORAL | Status: DC
  Administered 2016-10-30 – 2016-10-31 (×2): 60 mg via ORAL
  Filled 2016-10-30 (×2): qty 3

## 2016-10-30 MED ORDER — ENOXAPARIN SODIUM 40 MG/0.4ML ~~LOC~~ SOLN
40.0000 mg | SUBCUTANEOUS | Status: DC
  Administered 2016-10-30 – 2016-11-09 (×11): 40 mg via SUBCUTANEOUS
  Filled 2016-10-30 (×11): qty 0.4

## 2016-10-30 MED ORDER — ACETAMINOPHEN 325 MG PO TABS: 650.0000 mg | ORAL_TABLET | Freq: Four times a day (QID) | ORAL | Status: DC | PRN

## 2016-10-30 MED ORDER — ONDANSETRON HCL 4 MG PO TABS: 4.0000 mg | ORAL_TABLET | Freq: Four times a day (QID) | ORAL | Status: DC | PRN

## 2016-10-30 MED ORDER — PANTOPRAZOLE SODIUM 40 MG PO TBEC
40.0000 mg | DELAYED_RELEASE_TABLET | Freq: Every day | ORAL | Status: DC
Start: 2016-10-30 — End: 2016-11-09
  Administered 2016-10-30 – 2016-11-09 (×11): 40 mg via ORAL
  Filled 2016-10-30 (×11): qty 1

## 2016-10-30 MED ORDER — AMLODIPINE BESYLATE 10 MG PO TABS
10.0000 mg | ORAL_TABLET | Freq: Every day | ORAL | Status: DC
  Administered 2016-10-30 – 2016-11-09 (×11): 10 mg via ORAL
  Filled 2016-10-30 (×11): qty 1

## 2016-10-30 NOTE — IPOC Note (Signed)
Overall Plan of Care [facility]) Patient Details Name: Brittany Hartman MRN: [id number] DOB: 12/25/1965  Admitting Diagnosis: MS  Hospital Problems: Active Problems:   Multiple sclerosis (HCC)   Multiple sclerosis exacerbation (HCC)   Stage 2 chronic kidney disease   Morbid obesity (HCC)   Hypoalbuminemia due to protein-calorie malnutrition (HCC)     Functional Problem List: Nursing Safety, Perception, Motor, Sensory, Pain  PT Balance, Behavior, Edema, Endurance, Motor, Nutrition, Pain, Perception, Safety, Sensory  OT Balance, Endurance, Motor, Safety  SLP    TR         Basic ADL's: OT Grooming, Bathing, Dressing, Toileting     Advanced  ADL's: OT Simple Meal Preparation     Transfers: PT Bed Mobility, Bed to Chair, Car, Occupational psychologist, Research scientist (life sciences): PT Ambulation, Psychologist, prison and probation services, Stairs     Additional Impairments: OT    SLP        TR      Anticipated Outcomes Item Anticipated Outcome  Self Feeding MOD I  Swallowing      Basic self-care  S  Toileting  S   Bathroom Transfers S  Bowel/Bladder  Remain continent while in Rehab  Transfers  supervision  Locomotion  supervision  Communication     Cognition     Pain  0-2  Safety/Judgment  Free of falls and injuries   Therapy Plan: PT Intensity: Minimum of 1-2 x/day ,45 to 90 minutes PT Frequency: 5 out of 7 days PT Duration Estimated Length of Stay: 7-10 days OT Intensity: Minimum of 1-2 x/day, 45 to 90 minutes OT Frequency: 5 out of 7 days OT Duration/Estimated Length of Stay: 7-10         Team Interventions: Nursing Interventions Disease Management/Prevention, Medication Management, Psychosocial Support, Pain Management, Patient/Family Education, Discharge Planning  PT interventions Ambulation/gait training, Warden/ranger, Cognitive remediation/compensation, Community reintegration, Discharge planning, Disease management/prevention, DME/adaptive equipment  instruction, Functional mobility training, Neuromuscular re-education, Pain management, Patient/family education, Psychosocial support, Stair training, Therapeutic Activities, Therapeutic Exercise, UE/LE Coordination activities, UE/LE Strength taining/ROM  OT Interventions Warden/ranger, Firefighter, Discharge planning, DME/adaptive equipment instruction, Disease mangement/prevention, Functional mobility training, Pain management, Patient/family education, Self Care/advanced ADL retraining, Therapeutic Activities, Therapeutic Exercise, UE/LE Strength taining/ROM, UE/LE Coordination activities  SLP Interventions    TR Interventions    SW/CM Interventions Discharge Planning, Psychosocial Support, Patient/Family Education    Team Discharge Planning: Destination: PT-Home ,OT- Home , SLP-  Projected Follow-up: PT-Home health PT, OT-  Home health OT, SLP-  Projected Equipment Needs: PT-Rolling walker with 5" wheels, OT- To be determined, SLP-  Equipment Details: PT- , OT-  Patient/family involved in discharge planning: PT- Patient,  OT-Patient, SLP-   MD ELOS: 6-9 days. Medical Rehab Prognosis:  Good Assessment: 51 y.o.right handed femalewith history of hypertension. History taken from chart review and patient. Presented 10/24/2016 with progressive gait dysfunction over the past 6 years and recent increased falls. 2 level home with bed and bath upstairs. Per report, CT/MRI showed severe chronic supra-and infratentorial demyelination with superimposed hyperacute acute demyelinating lesions including left superior cerebellar peduncle. Neurology consulted placed on intravenous Solu-MedrolAnd transitioned to prednisone 60 mg daily and tapering over 12 days. MRI cervical and thoracic spine showed extensive patchy signal abnormalities throughout the cervical and thoracic spine consistent with demyelinating disease. There was some additional small disc protrusions at C2-3, C3-4, C4-5  and C6-7 without significant stenosis. Pt with resulting functional deficits with mobility and endurance. Will set goals for Supervision  with PT/OT.  See Team Conference Notes for weekly updates to the plan of care

## 2016-10-30 NOTE — Progress Notes (Signed)
[facility] PHYSICAL MEDICINE & REHABILITATION     PROGRESS NOTE  Subjective/Complaints:  Pt seen working with PT this AM.  She slept well overnight.  She is ready to begin her day and really wants to speak with SW.  ROS: Denies CP, SOB, N/V/D.  Objective: Vital Signs: Blood pressure (!) 151/98, pulse 90, temperature 98.2 F (36.8 C), temperature source Oral, resp. rate 20, weight 120.2 kg (265 lb), SpO2 100 %. No results found.  Recent Labs  10/29/16 0524 10/30/16 0629  WBC 6.5 5.8  HGB 14.4 13.8  HCT 44.4 42.0  PLT 230 213    Recent Labs  10/29/16 0524 10/30/16 0629  NA 142 142  K 3.8 3.7  CL 109 109  GLUCOSE 144* 123*  BUN 21* 21*  CREATININE 1.14* 1.00  CALCIUM 8.7* 8.6*   CBG (last 3)   Recent Labs  10/29/16 1653 10/29/16 2157 10/30/16 0637  GLUCAP 117* 138* 116*    Wt Readings from Last 3 Encounters:  10/29/16 120.2 kg (265 lb)  10/25/16 120.8 kg (266 lb 4.8 oz)  10/18/16 102.1 kg (225 lb)    Physical Exam:  BP (!) 151/98   Pulse 90   Temp 98.2 F (36.8 C) (Oral)   Resp 20   Wt 120.2 kg (265 lb)   SpO2 100%   BMI 42.77 kg/m  Constitutional: She appears well-developed. Obese  HENT: Normocephalic and atraumatic.  Eyes: EOMI. No discharge.  Cardiovascular: Normal rate and regular rhythm.  No JVD. Respiratory: Effort normal and breath sounds normal.  GI: Soft. Bowel sounds are normal.  Musculoskeletal: She exhibits edema. She exhibits no tenderness.  Neurological: She is alert and oriented.  Motor: 5/5 throughout Skin: Skin is warm and dry.  Psychiatric: She has a normal mood and affect. Her behavior is normal.    Assessment/Plan: 1. Functional deficits secondary to multiple sclerosis newly diagnosed with exacerbation which require 3+ hours per day of interdisciplinary therapy in a comprehensive inpatient rehab setting. Physiatrist is providing close team supervision and 24 hour management of active medical problems listed  below. Physiatrist and rehab team continue to assess barriers to discharge/monitor patient progress toward functional and medical goals.  Function:  Bathing Bathing position      Bathing parts      Bathing assist        Upper Body Dressing/Undressing Upper body dressing                    Upper body assist        Lower Body Dressing/Undressing Lower body dressing                                  Lower body assist        Toileting Toileting          Toileting assist     Transfers Chair/bed transfer             Locomotion Ambulation           Wheelchair          Cognition Comprehension    Expression    Social Interaction    Problem Solving    Memory      Medical Problem List and Plan: 1.  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   Continue prednisone 60 mg daily and taper over 12 days total (to be completed ~4/28)  Begin CIR 2.  DVT Prophylaxis/Anticoagulation: Subcutaneous Lovenox. Monitor for any bleeding episodes.   Vascular study pending 3. Pain Management: Baclofen 10 mg twice a day 4. Mood: Provide emotional support 5. Neuropsych: This patient is capable of making decisions on her own behalf. 6. Skin/Wound Care: Routine skin checks 7. Fluids/Electrolytes/Nutrition: Routine I&Os 8. Neurogenic bowel and bladder.   Check PVR 3.   Adjust bowel program 9. Hypertension. Norvasc 10 mg daily, hydralazine 50 mg 3 times a day.   Monitor with increased mobility 10. CKD:   Cr 1.00 on 4/17  Cont to monitor 11. Steroid induced hyperglycemia: SSI  Overall controlled 4/17 12. Morbid obesity  Body mass index is 42.77 kg/m.  Diet and exercise education  Encourage weight loss to increase endurance and promote overall health 13. Hypoalbuminemia  Supplement initiated 4/17  LOS (Days) 1 A FACE TO FACE EVALUATION WAS PERFORMED  Dashonna Chagnon Karis Juba 10/30/2016 8:42 AM

## 2016-10-30 NOTE — Progress Notes (Signed)
Chaplain responded to consult for patient.  Patient talks about never being in the hospital before and never being diagnosed with anything prior.  Patient says she knows Kristen Cardinal and she feels everything will be alright.  Patient further talks about the large prayer support group she has of family members, her pastor and other who pray for her often.  Patient welcomes prayer from the [facility].   Chaplain prayed with patient, provided active listening and ministry of presence.    10/30/16 1154  Clinical Encounter Type  Visited With Patient  Visit Type Initial;Psychological support;Spiritual support;Social support  Spiritual Encounters  Spiritual Needs Prayer  Stress Factors  Patient Stress Factors Health changes   Beryl Meager, Chaplain

## 2016-10-30 NOTE — Progress Notes (Signed)
*  PRELIMINARY RESULTS* Vascular Ultrasound Lower extremity venous duplex has been completed.  Preliminary findings: No evidence of DVT or baker's cyst.  Farrel Demark, RDMS, RVT  10/30/2016, 11:18 AM

## 2016-10-30 NOTE — Progress Notes (Signed)
Occupational Therapy Session Note  Patient Details  Name: Brittany Hartman MRN: [id number] Date of Birth: 1966-05-07  Today's Date: 10/30/2016 OT Individual Time: 5621-3086 OT Individual Time Calculation (min): 74 min    Short Term Goals: Week 1:  OT Short Term Goal 1 (Week 1): STGs=LTG d/t ELOS  Skilled Therapeutic Interventions/Progress Updates:  Pt completed supine to sit EOB with min assist.  Once sitting worked on donning pants with mod assist from therapist secondary to not being able to place the LLE in and then pull up over her foot.  Min assist for sit to stand in order to pull them up over her hips.  She was then able to transfer to the wheelchair stand pivot with min assist using the RW.  Mod demonstrational cueing for correct use of walker.  Pt rolled herself to the ADL apartment with occasional min assist for steering the wheelchair.  Once in the ADL apartment she was able to complete wheelchair to bed transfer with min assist using the RW but needed mod assist for transition from sitting to supine.  She also completed toilet transfer to the elevated toilet with min assist as well using the RW.  Finished session with return to the room with pt left up in wheelchair with call button and phone in reach.  She was able to verbalize the correct way to call for assistance.    Therapy Documentation Precautions:  Precautions Precautions: Fall Restrictions Weight Bearing Restrictions: No  Pain: Pain Assessment Pain Assessment: No/denies pain ADL: See Function Navigator for Current Functional Status.   Therapy/Group: Individual Therapy  Odetta Forness OTR/L 10/30/2016, 4:10 PM

## 2016-10-30 NOTE — Progress Notes (Signed)
Social Work Assessment and Plan Social Work Assessment and Plan  Patient Details  Name: Brittany Hartman MRN: [id number] Date of Birth: 27-Mar-1966  Today's Date: 10/30/2016  Problem List:  Patient Active Problem List   Diagnosis Date Noted  . Multiple sclerosis exacerbation ([place]) 10/30/2016  . Stage 2 chronic kidney disease   . Morbid obesity ([place])   . Hypoalbuminemia due to protein-calorie malnutrition ([place])   . MS (multiple sclerosis) ([place])   . Neurologic gait disorder   . Muscle spasm   . Neurogenic bowel   . Neurogenic bladder   . Benign essential HTN   . Steroid-induced hyperglycemia   . Stage 3 chronic kidney disease   . Multiple sclerosis ([place]) 10/24/2016  . Essential hypertension 10/24/2016  . Acute cystitis with hematuria 10/24/2016   Past Medical History:  Past Medical History:  Diagnosis Date  . Hypertension    Past Surgical History: History reviewed. No pertinent surgical history. Social History:  reports that she has never smoked. She has never used smokeless tobacco. She reports that she does not drink alcohol or use drugs.  Family / Support Systems Marital Status: Single Patient Roles: Other (Comment) (sibling & cousin) Other Supports: Lynette-cousin [phone]-cell  Anticipated Caregiver: Barbaraann Rondo or cousin unsure where she is going Ability/Limitations of Caregiver: Barbaraann Rondo is elderly and can only provide supervision level Caregiver Availability: Other (Comment) (coming up with a plan) Family Dynamics: Has been living with cousin for the past month since coming here from [place]. She has been told she whould go to a NH and get all the services in place-ie SSD and MA. She does have friends and extended family who are supportive.  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 Discussed the length of time each of these take and can be applied for but will not be approved before she leaves the hospital. She will work hard and try to get as independent as possible while here. Recent Psychosocial Issues: medical issues which was misdiagnosed-told by MD in [place] she had arthritis in her legs reason stiff, due to she was getting older. Pyschiatric History: No history deferred depression screen due to tired but would benefit from seeing neuro-psych while here due to new diagnosis and the impact of finding out she has a chronic long term disease. Will make referral to be seen while here. Substance Abuse History: No issues  Patient / Family Perceptions, Expectations & Goals Pt/Family understanding of illness & functional limitations: Pt is able to discuss the diagnosis-MS she is still finding out information regarding this and is talking with MD and staff regarding what this means. Will get information on the MS organization for pt. Premorbid pt/family roles/activities: Sister, cousin, niece, friend, etc Anticipated changes in roles/activities/participation: resume Pt/family expectations/goals: Pt states: " I want to do well but need to be able to take care of myself, everybody works."    [employer]: None Premorbid Home Care/DME Agencies: None  Transportation available at discharge: family Resource referrals recommended: Support  group (specify), Neuropsychology  Discharge Planning Living Arrangements: Other relatives Support Systems: Other relatives Type of Residence: Private residence Insurance Resources: Self-pay (Will need to apply for [employer]) Financial Resources: Family Support Financial Screen Referred: Yes Living Expenses: Lives with family Money Management: Family Does the patient have any problems obtaining your medications?: Yes (Describe) (uninsured ) Home Management: She and cousin Patient/Family Preliminary Plans: Unsure at this time she has been told by family to go to a NH from here. Discussed she has no insurance to pay for a NH and she may be too high level by the time she leaves here. Team is feeling she will be supervision-mod/i by discharge from CIR. Social Work Anticipated Follow Up Needs: HH/OP, Support Group  Clinical Impression Pleasant female who wants to do well here. Her discharge plan is up in the air, she was told by family/friends to go to a NH from here, which is not an option for her due to uninsured and too high level by discharge. She will need someone to Make sure she is safe and her needs are met when she leaves here. Will work on Location manager for disability and Medicaid but if going to [place] will need to apply for medicaid in that state. Will have neuro-psych see for coping due to new diagnosis and still processing all of this. Keane Scrape counselor to come to pt's room today to answer her questions. Will work on the best plan for pt for discharge.  Elease Hashimoto 10/30/2016, 1:02 PM

## 2016-10-30 NOTE — Progress Notes (Signed)
Patient has her small brown purse witness by NT ( [place]ell) at her side

## 2016-10-30 NOTE — Progress Notes (Signed)
Arrived to Rehab department after receiving report from RN on 5W Department via bed, Alert, oriented x4,denies pain or discomfort upon arriving,made as comfortable as possible oriented to surrounding. Admission data initiated refer as needed

## 2016-10-30 NOTE — Evaluation (Signed)
Physical Therapy Assessment and Plan  Patient Details  Name: Brittany Hartman MRN: [id number] Date of Birth: 1966-04-29  PT Diagnosis: Abnormal posture, Abnormality of gait, Ataxic gait, Coordination disorder and Muscle weakness Rehab Potential: Good ELOS: 7-10 days   Today's Date: 10/30/2016 PT Individual Time: 7829-5621 PT Individual Time Calculation (min): 55 min    Problem List:  Patient Active Problem List   Diagnosis Date Noted  . Multiple sclerosis exacerbation ([place]) 10/30/2016  . Stage 2 chronic kidney disease   . Morbid obesity ([place])   . Hypoalbuminemia due to protein-calorie malnutrition ([place])   . MS (multiple sclerosis) ([place])   . Neurologic gait disorder   . Muscle spasm   . Neurogenic bowel   . Neurogenic bladder   . Benign essential HTN   . Steroid-induced hyperglycemia   . Stage 3 chronic kidney disease   . Multiple sclerosis ([place]) 10/24/2016  . Essential hypertension 10/24/2016  . Acute cystitis with hematuria 10/24/2016    Past Medical History:  Past Medical History:  Diagnosis Date  . Hypertension    Past Surgical History: History reviewed. No pertinent surgical history.  Assessment & Plan Clinical Impression: Brittany McKinnonis a 51 y.o.right handed femalewith history of hypertension. History taken from chart review and patient. Presented 10/24/2016 with progressive gait dysfunction over the past 6 years and recent increased falls. Per chart review patient lives with cousin independent prior to admission. 2 level home with bed and bath upstairs.Her cousin works during the day with family can check on her as needed.MRI spine reviewed with patient showing C/T-spine lesions. Per report, CT/MRI showed severe chronic supra-and infratentorial demyelination with superimposed hyperacute acute demyelinating lesions including left superior cerebellar peduncle. Neurology consulted placed on intravenous Solu-MedrolAnd transitioned to prednisone 60 mg daily and tapering  over 12 days. MRI cervical and thoracic spine showed extensive patchy signal abnormalities throughout the cervical and thoracic spine consistent with demyelinating disease. There was some additional small disc protrusions at C2-3, C3-4, C4-5 and C6-7 without significant stenosis. Patient transferred to CIR on 10/29/2016 .   Patient currently requires mod with mobility secondary to muscle weakness and muscle joint tightness, decreased cardiorespiratoy endurance, abnormal tone, ataxia and decreased coordination, decreased problem solving, decreased safety awareness and delayed processing and decreased sitting balance, decreased standing balance, decreased postural control and decreased balance strategies.  Prior to hospitalization, patient was independent  with mobility and lived with Family in a   home.  Home access is   .  Patient will benefit from skilled PT intervention to maximize safe functional mobility, minimize fall risk and decrease caregiver burden for planned discharge home with 24 hour supervision.  Anticipate patient will benefit from follow up [place] at discharge.  PT - End of Session Activity Tolerance: Decreased this session Endurance Deficit: Yes Endurance Deficit Description: requires rest breaks with functional mobility PT Assessment Rehab Potential (ACUTE/IP ONLY): Good Barriers to Discharge: [place] home environment;Decreased caregiver support;Other (comment) Barriers to Discharge Comments: unclear discharge plan PT Patient demonstrates impairments in the following area(s): Balance;Behavior;Edema;Endurance;Motor;Nutrition;Pain;Perception;Safety;Sensory PT Transfers Functional Problem(s): Bed Mobility;Bed to Chair;Car;Furniture PT Locomotion Functional Problem(s): Ambulation;Wheelchair Mobility;Stairs PT Plan PT Intensity: Minimum of 1-2 x/day ,45 to 90 minutes PT Frequency: 5 out of 7 days PT Duration Estimated Length of Stay: 7-10 days PT Treatment/Interventions:  Ambulation/gait training;Balance/vestibular training;Cognitive remediation/compensation;Community reintegration;Discharge planning;Disease management/prevention;DME/adaptive equipment instruction;Functional mobility training;Neuromuscular re-education;Pain management;Patient/family education;Psychosocial support;Stair training;Therapeutic Activities;Therapeutic Exercise;UE/LE Coordination activities;UE/LE Strength taining/ROM PT Transfers Anticipated Outcome(s): supervision PT Locomotion Anticipated Outcome(s): supervision PT Recommendation Recommendations for Other  Services: Neuropsych consult;Therapeutic Recreation consult Therapeutic Recreation Interventions: Pet therapy;Kitchen group;Stress management;Outing/community reintergration Follow Up Recommendations: Home health PT Patient destination: Home Equipment Recommended: Rolling walker with 5" wheels  Skilled Therapeutic Intervention Skilled therapeutic intervention initiated after completion of evaluation. Discussed with patient falls risk, safety within room, focus of therapy during stay, possible length of stay, goals, and follow-up therapy. Patient requires mod A with HHA progressed to min A using RW with max cues for safety and sequencing functional mobility. Patient requesting to speak with CSW because she would like to go to nursing home. Educated patient on anticipated level of assistance at discharge and not needing to go to SNF at this time. Patient also reported she can't go to anyone's house with stairs, however educated patient on ability to negotiate 12 steps with 2 rails this date with min A. Patient highly motivated but would benefit from significant education regarding new diagnosis of MS. Patient left sitting in wheelchair with all needs within reach.   PT Evaluation Precautions/Restrictions Precautions Precautions: Fall Restrictions Weight Bearing Restrictions: No General Chart Reviewed: Yes Family/Caregiver Present: No  Vital SignsTherapy Vitals Pulse Rate: 90 BP: (!) 151/98 Patient Position (if appropriate): Sitting (after stairs) Oxygen Therapy SpO2: 100 % O2 Device: Not Delivered Pain Pain Assessment Pain Assessment: No/denies pain Home Living/Prior Functioning Home Living Available Help at Discharge: Family;Available PRN/intermittently Additional Comments: pt reports plan to go to uncle's in [place], [place] but reports having more family support in [place], [place], pt also asking about nursing home  Lives With: Family Prior Function Level of Independence: Independent with gait;Independent with transfers;Independent with homemaking with ambulation;Independent with basic ADLs  Able to Take Stairs?: Yes Driving: Yes Vocation: Unemployed Vocation Requirements: former Therapist, art rep Leisure: Hobbies-yes (Comment) Comments: drive, go out, very independent Vision/Perception   No change from baseline  Cognition Overall Cognitive Status: No family/caregiver present to determine baseline cognitive functioning Arousal/Alertness: Awake/alert Orientation Level: Oriented X4 Attention: Focused Focused Attention: Appears intact Awareness: Appears intact Sensation Sensation Light Touch: Appears Intact Stereognosis: Appears Intact Hot/Cold: Appears Intact Proprioception: Appears Intact Coordination Gross Motor Movements are Fluid and Coordinated: No Fine Motor Movements are Fluid and Coordinated: No Coordination and Movement Description: ataxic Heel Shin Test: impaired coordination LLE > RLE Motor  Motor Motor: Abnormal postural alignment and control;Ataxia  Mobility Transfers Transfers: Yes Sit to Stand: 3: Mod assist;With upper extremity assist Stand to Sit: 4: Min guard;With upper extremity assist Locomotion  Ambulation Ambulation: Yes Ambulation/Gait Assistance: 3: Mod assist Ambulation Distance (Feet): 60 Feet Assistive device: 1 person hand held assist Gait Gait: Yes Gait Pattern:  Impaired Gait Pattern: Step-through pattern;Decreased stride length;Poor foot clearance - right;Poor foot clearance - left;Trunk flexed;Ataxic;Decreased hip/knee flexion - left;Decreased hip/knee flexion - right Gait velocity: decreased Stairs / Additional Locomotion Stairs: Yes Stairs Assistance: 4: Min assist Stair Management Technique: Alternating pattern;Step to pattern;Forwards Number of Stairs: 12 Height of Stairs: 3 (8 3", 4 6") Wheelchair Mobility Wheelchair Mobility: Yes Wheelchair Assistance: 5: Careers information officer: Both upper extremities Wheelchair Parts Management: Needs assistance Distance: 50 ft  Trunk/Postural Assessment  Cervical Assessment Cervical Assessment: Within Functional Limits Thoracic Assessment Thoracic Assessment: Within Functional Limits Lumbar Assessment Lumbar Assessment: Exceptions to [facility] (posterior pelvic tilt) Postural Control Postural Control: Deficits on evaluation Protective Responses: impaired  Balance Balance Balance Assessed: Yes Static Standing Balance Static Standing - Balance Support: Left upper extremity supported Static Standing - Level of Assistance: 4: Min assist Dynamic Standing Balance Dynamic Standing - Balance Support: Left upper  extremity supported;During functional activity Dynamic Standing - Level of Assistance: 3: Mod assist Extremity Assessment  RUE Assessment RUE Assessment: Exceptions to [facility] (generalized weakness) LUE Assessment LUE Assessment: Exceptions to [facility] (generalized weakness) RLE Assessment RLE Assessment: Within Functional Limits LLE Assessment LLE Assessment: Within Functional Limits   See Function Navigator for Current Functional Status.   Refer to Care Plan for Long Term Goals  Recommendations for other services: Neuropsych and Therapeutic Recreation  Pet therapy, Kitchen group and Stress management  Discharge Criteria: Patient will be discharged from PT if patient refuses  treatment 3 consecutive times without medical reason, if treatment goals not met, if there is a change in medical status, if patient makes no progress towards goals or if patient is discharged from hospital.  The above assessment, treatment plan, treatment alternatives and goals were discussed and mutually agreed upon: by patient  Jordane Hisle, Murray Hodgkins 10/30/2016, 9:22 AM

## 2016-10-30 NOTE — Care Management Note (Signed)
[facility]patient [facility] Individual Statement of Services  Patient Name:  Brittany Hartman  Date:  10/30/2016  Welcome to the [facility].  Our goal is to provide you with an individualized program based on your diagnosis and situation, designed to meet your specific needs.  With this comprehensive rehabilitation program, you will be expected to participate in at least 3 hours of rehabilitation therapies Monday-Friday, with modified therapy programming on the weekends.  Your rehabilitation program will include the following services:  Physical Therapy (PT), Occupational Therapy (OT), 24 hour per day rehabilitation nursing, Therapeutic Recreaction (TR), Neuropsychology, Case Management (Social Worker), Rehabilitation Medicine, Nutrition Services and Pharmacy Services  Weekly team conferences will be held on Wednesday to discuss your progress.  Your Social Worker will talk with you frequently to get your input and to update you on team discussions.  Team conferences with you and your family in attendance may also be held.  Expected length of stay: 7-10 days Overall anticipated outcome: supervision with some cueing  Depending on your progress and recovery, your program may change. Your Social Worker will coordinate services and will keep you informed of any changes. Your Social Worker's name and contact numbers are listed  below.  The following services may also be recommended but are not provided by the In[facility]:   Driving Evaluations  Home Health Rehabiltiation Services  Outpatient Rehabilitation Services  Vocational Rehabilitation   Arrangements will be made to provide these services after discharge if needed.  Arrangements include referral to agencies that provide these services.  Your insurance has been verified to be:  None Your primary doctor is:  None  Pertinent information will be shared with your doctor and your insurance  company.  Social Worker:  Dossie Der, SW [phone] or ([phone]  Information discussed with and copy given to patient by: Lucy Chris, 10/30/2016, 9:47 AM

## 2016-10-30 NOTE — Evaluation (Signed)
Occupational Therapy Assessment and Plan  Patient Details  Name: Brittany Hartman MRN: [id number] Date of Birth: [birth year]  OT Diagnosis: abnormal posture, ataxia and muscle weakness (generalized) Rehab Potential:   ELOS: 7-10   Today's Date: 10/30/2016 OT Individual Time: 1700-1749 OT Individual Time Calculation (min): 65 min     Problem List:  Patient Active Problem List   Diagnosis Date Noted  . Multiple sclerosis exacerbation ([place]) 10/30/2016  . Stage 2 chronic kidney disease   . Morbid obesity ([place])   . Hypoalbuminemia due to protein-calorie malnutrition ([place])   . MS (multiple sclerosis) ([place])   . Neurologic gait disorder   . Muscle spasm   . Neurogenic bowel   . Neurogenic bladder   . Benign essential HTN   . Steroid-induced hyperglycemia   . Stage 3 chronic kidney disease   . Multiple sclerosis ([place]) 10/24/2016  . Essential hypertension 10/24/2016  . Acute cystitis with hematuria 10/24/2016    Past Medical History:  Past Medical History:  Diagnosis Date  . Hypertension    Past Surgical History: History reviewed. No pertinent surgical history.  Assessment & Plan Clinical Impression: Brittany McKinnonis a 51 y.o.right handed femalewith history of hypertension. History taken from chart review and patient. Presented 10/24/2016 with progressive gait dysfunction over the past 6 years and recent increased falls. Per chart review patient lives with cousin independent prior to admission. 2 level home with bed and bath upstairs.Her cousin works during the day with family can check on her as needed.MRI spine reviewed with patient showing C/T-spine lesions. Per report, CT/MRI showed severe chronic supra-and infratentorial demyelination with superimposed hyperacute acute demyelinating lesions including left superior cerebellar peduncle. Neurology consulted placed on intravenous Solu-MedrolAnd transitioned to prednisone 60 mg daily and tapering over 12 days. MRI cervical and  thoracic spine showed extensive patchy signal abnormalities throughout the cervical and thoracic spine consistent with demyelinating disease. There was some additional small disc protrusions at C2-3, C3-4, C4-5 and C6-7 without significant stenosis. Subcutaneous Lovenox for DVT prophylaxis. Physical and occupational therapy evaluation completed with recommendations of physical medicine rehabilitation consult. Patient was admitted for a comprehensive rehabilitation program  Patient currently requires  mod with basic self-care skills secondary to muscle weakness, decreased cardiorespiratoy endurance and decreased problem solving and decreased safety awareness.  Prior to hospitalization, patient could complete ADLs with modified independent .  Patient will benefit from skilled intervention to increase independence with basic self-care skills prior to discharge home with care partner.  Anticipate patient will require 24 hour supervision and follow up home health.  OT - End of Session Activity Tolerance: Tolerates 10 - 20 min activity with multiple rests Endurance Deficit: Yes Endurance Deficit Description: requires rest breaks with functional mobility OT Assessment Barriers to Discharge: Decreased caregiver support OT Patient demonstrates impairments in the following area(s): Balance;Endurance;Motor;Safety OT Basic ADL's Functional Problem(s): Grooming;Bathing;Dressing;Toileting OT Advanced ADL's Functional Problem(s): Simple Meal Preparation OT Transfers Functional Problem(s): Toilet;Tub/Shower OT Plan OT Intensity: Minimum of 1-2 x/day, 45 to 90 minutes OT Frequency: 5 out of 7 days OT Duration/Estimated Length of Stay: 7-10 OT Treatment/Interventions: Balance/vestibular training;Community reintegration;Discharge planning;DME/adaptive equipment instruction;Disease mangement/prevention;Functional mobility training;Pain management;Patient/family education;Self Care/advanced ADL retraining;Therapeutic  Activities;Therapeutic Exercise;UE/LE Strength taining/ROM;UE/LE Coordination activities OT Self Feeding Anticipated Outcome(s): MOD I OT Basic Self-Care Anticipated Outcome(s): S OT Toileting Anticipated Outcome(s): S OT Bathroom Transfers Anticipated Outcome(s): S OT Recommendation Recommendations for Other Services: Therapeutic Recreation consult Therapeutic Recreation Interventions: [place] group Patient destination: Home Follow Up Recommendations: Home health OT Equipment Recommended: To  be determined   Skilled Therapeutic Intervention 1:1. Focus of session on functional mobility and ADL retraining. Pt sit to stand with RW with MOD A from w/c with A for lifting and balance and VC for trunk flexion, safety awareness and to slow down d/t impulsivity. Pt stand pivot transfer onto TTB with RW with MOD A and VC for safety awareness, sequencing of transfer and RW management. Pt bathes seated on TTB 9/10 bod parts with A to wash back. Pt stands relying heavily on pulling up on grab bars despite VC from OT to push from TTB with MIN A for balance to wash peri area and buttocks. Pt ambulates to standard toilet and voids urine with increased time and MIN A for balance and VC for safety awareness and foot clearance while stepping. Pt completes hygiene standing with MOD A using grab bars to stabilize and VC for safety awareness. Pt ambulates out of bathroom with MIN A for balance and VC for RW management. In w/c pt dons bra and pull over shirt with set up. Pt requires A for threading B LE into pants and completes sit to stand as stated above with RW to stabilize while pt advances pants over hips. Pt unable to don non slip socks, and OT provides total A for donning B socks. OT educated on AE pt will use next session for LB dressing (sock aide, reacher). In w/c pt brushes teeth with set up. Exited session with pt seated in w/c and CSW present.   OT Evaluation Precautions/Restrictions   Precautions Precautions: Fall Restrictions Weight Bearing Restrictions: No General Chart Reviewed: Yes Pain Pain Assessment Pain Assessment: No/denies pain Home Living/Prior Functioning Home Living Available Help at Discharge: Family, Available PRN/intermittently Type of Home: Other(Comment) Home Access: Stairs to enter Home Layout: Two level, 1/2 bath on main level, Bed/bath upstairs Alternate Level Stairs-Rails: Right, Left Bathroom Shower/Tub: Tub/shower unit, Architectural technologist: Standard Bathroom Accessibility: Yes Additional Comments: pt reports plan to go to uncle's in [place], [place] but reports having more family support in [place], [place], pt also asking about nursing home  Lives With: Family IADL History Homemaking Responsibilities: Yes Meal Prep Responsibility: Secondary Laundry Responsibility: Secondary Cleaning Responsibility: Secondary Bill Paying/Finance Responsibility: Secondary Shopping Responsibility: Secondary Child Care Responsibility: No Current License: Yes Mode of Transportation: Car Type of Occupation: Wants to work/but plans to apply for disability Leisure and Hobbies: watching tv and cooking Prior Function Level of Independence: Independent with gait, Independent with transfers, Independent with homemaking with ambulation, Independent with basic ADLs  Able to Take Stairs?: Yes Driving: Yes Vocation: Unemployed Vocation Requirements: former Therapist, art rep Leisure: Hobbies-yes (Comment) Comments: drive, go out, very independent ADL   Vision/Perception  Vision- History Baseline Vision/History: Wears glasses Wears Glasses: Distance only Patient Visual Report: No change from baseline Vision- Assessment Vision Assessment?: No apparent visual deficits  Cognition Overall Cognitive Status: No family/caregiver present to determine baseline cognitive functioning Arousal/Alertness: Awake/alert Orientation Level: Person;Place;Situation Person:  Oriented Place: Oriented Situation: Oriented Year: 2018 Month: April Day of Week: Incorrect Immediate Memory Recall: Sock;Blue;Bed Memory Recall: Sock;Blue;Bed Memory Recall Sock: Without Cue Memory Recall Blue: Without Cue Memory Recall Bed: Without Cue Attention: Focused Focused Attention: Appears intact Awareness: Appears intact Problem Solving: Impaired Problem Solving Impairment: Functional complex Behaviors: Impulsive Safety/Judgment: Impaired Comments: pt demo diffifulty with sequencing and safety awareness during transfers/managing RW Sensation Sensation Light Touch: Appears Intact Stereognosis: Appears Intact Hot/Cold: Appears Intact Proprioception: Appears Intact Coordination Gross Motor Movements are Fluid and Coordinated: No  Fine Motor Movements are Fluid and Coordinated: No Coordination and Movement Description: ataxic Heel Shin Test: impaired coordination LLE > RLE Motor  Motor Motor: Abnormal postural alignment and control;Ataxia Mobility  Transfers Sit to Stand: 3: Mod assist;With upper extremity assist Stand to Sit: 4: Min guard;With upper extremity assist  Trunk/Postural Assessment  Cervical Assessment Cervical Assessment: Within Functional Limits Thoracic Assessment Thoracic Assessment: Within Functional Limits Lumbar Assessment Lumbar Assessment: Exceptions to [facility] (posterior pelvic tilt) Postural Control Postural Control: Deficits on evaluation Protective Responses: impaired  Balance Balance Balance Assessed: Yes Static Standing Balance Static Standing - Balance Support: During functional activity;Left upper extremity supported Static Standing - Level of Assistance: 4: Min assist Dynamic Standing Balance Dynamic Standing - Balance Support: Left upper extremity supported;During functional activity Dynamic Standing - Level of Assistance: 3: Mod assist Extremity/Trunk Assessment RUE Assessment RUE Assessment: Exceptions to [facility] (generalized  weakness) LUE Assessment LUE Assessment: Exceptions to [facility] (generalized weakness)   See Function Navigator for Current Functional Status.   Refer to Care Plan for Long Term Goals  Recommendations for other services: Therapeutic Recreation  Kitchen group   Discharge Criteria: Patient will be discharged from OT if patient refuses treatment 3 consecutive times without medical reason, if treatment goals not met, if there is a change in medical status, if patient makes no progress towards goals or if patient is discharged from hospital.  The above assessment, treatment plan, treatment alternatives and goals were discussed and mutually agreed upon: by patient  Tonny Branch 10/30/2016, 11:09 AM

## 2016-10-31 ENCOUNTER — Inpatient Hospital Stay (HOSPITAL_COMMUNITY): Payer: Self-pay | Admitting: Physical Therapy

## 2016-10-31 ENCOUNTER — Inpatient Hospital Stay (HOSPITAL_COMMUNITY): Payer: Self-pay | Admitting: Occupational Therapy

## 2016-10-31 MED ORDER — HYDRALAZINE HCL 25 MG PO TABS
75.0000 mg | ORAL_TABLET | Freq: Three times a day (TID) | ORAL | Status: DC
  Administered 2016-10-31 – 2016-11-09 (×28): 75 mg via ORAL
  Filled 2016-10-31 (×29): qty 1

## 2016-10-31 MED ORDER — SENNOSIDES-DOCUSATE SODIUM 8.6-50 MG PO TABS
1.0000 | ORAL_TABLET | Freq: Two times a day (BID) | ORAL | Status: DC
  Administered 2016-10-31 – 2016-11-09 (×19): 1 via ORAL
  Filled 2016-10-31 (×18): qty 1

## 2016-10-31 MED ORDER — BISACODYL 10 MG RE SUPP
10.0000 mg | Freq: Every day | RECTAL | Status: DC | PRN
  Administered 2016-10-31: 10 mg via RECTAL
  Filled 2016-10-31: qty 1

## 2016-10-31 NOTE — Progress Notes (Signed)
   10/31/16 1345  What Happened  Was fall witnessed? Yes  Who witnessed fall? Paulo Fruit  Patients activity before fall bathroom-assisted  Point of contact buttocks  Was patient injured? No  Follow Up  MD notified Dr. Allena Katz  Time MD notified 1400  Family notified No- patient refusal  Additional tests No  Progress note created (see row info) Yes  Adult Fall Risk Assessment  Risk Factor Category (scoring not indicated) Fall has occurred during this admission (document High fall risk)  Patient's Fall Risk High Fall Risk (>13 points)  Adult Fall Risk Interventions  Required Bundle Interventions *See Row Information* High fall risk - low, moderate, and high requirements implemented  Screening for Fall Injury Risk  Risk For Fall Injury- See Row Information  None identified  Vitals  Temp 98.6 F (37 C)  BP 136/76  Pulse Rate 87  Oxygen Therapy  SpO2 99 %  O2 Device Room Air  Pain Assessment  Pain Assessment No/denies pain  PCA/Epidural/Spinal Assessment  Respiratory Pattern Regular  Neurological  Neuro (WDL) X (no new changes)  Musculoskeletal  Musculoskeletal (WDL) X (preadmission baseline)  Integumentary  Integumentary (WDL) X (preadmission baseline)   Patient was given numerous bowel medications for extreme constipation.  Patient was being assisted to the toilet per recommendations on safety sheet.  Patient was anxious to not have an accident and got unsteady on her feet.  Nursing student helped patient to floor.  Nursing student and NT assisted patient to toilet.  Patient reports no pain, no obvious injuries noted.  Will continue with post fall protocol.  No changes made to safety plan at this time due to circumstances.  Dani Gobble, RN

## 2016-10-31 NOTE — Plan of Care (Signed)
Problem: RH BOWEL ELIMINATION Goal: RH STG MANAGE BOWEL W/MEDICATION W/ASSISTANCE STG Manage Bowel with Medication with min  Assistance.   Outcome: Not Progressing No BM since 10/25/2016, medicated with Sorbitol on dy shift with no result, patient was given Prune juce, monitoring result   Problem: RH SAFETY Goal: RH STG ADHERE TO SAFETY PRECAUTIONS W/ASSISTANCE/DEVICE STG Adhere to Safety Precautions With  Mod I   Outcome: Progressing Safety precautions maintained, OOB to BR with minimal assistance, no fall noted this shift  Problem: Pain Managment: Goal: General experience of comfort will improve Outcome: Progressing Denied pain and discomfort

## 2016-10-31 NOTE — Patient Care Conference (Signed)
Inpatient RehabilitationTeam Conference and Plan of Care Update Date: 10/31/2016   Time: 2:30 PM    Patient Name: Brittany Hartman      Medical Record Number: [id number]  Date of Birth: 1966/02/03 Sex: Female         Room/Bed: 4W05C/4W05C-01 Payor Info: Payor: MEDICAID POTENTIAL / Plan: MEDICAID POTENTIAL / Product Type: *No Product type* /    Admitting Diagnosis: MS  Admit Date/Time:  10/29/2016  9:18 PM Admission Comments: No comment available   Primary Diagnosis:  <principal problem not specified> Principal Problem: <principal problem not specified>  Patient Active Problem List   Diagnosis Date Noted  . Multiple sclerosis exacerbation ([facility]) 10/30/2016  . Stage 2 chronic kidney disease   . Morbid obesity ([facility])   . Hypoalbuminemia due to protein-calorie malnutrition ([facility])   . MS (multiple sclerosis) ([facility])   . Neurologic gait disorder   . Muscle spasm   . Neurogenic bowel   . Neurogenic bladder   . Benign essential HTN   . Steroid-induced hyperglycemia   . Stage 3 chronic kidney disease   . Multiple sclerosis ([facility]) 10/24/2016  . Essential hypertension 10/24/2016  . Acute cystitis with hematuria 10/24/2016    Expected Discharge Date: Expected Discharge Date: 11/09/16  Team Members Present: Physician leading conference: Dr. Maryla Morrow Social Worker Present: Dossie Der, LCSW Nurse Present: Ronny Bacon, RN PT Present: Bayard Hugger, Nita Sickle, PT OT Present: Perrin Maltese, OT PPS Coordinator present : Tora Duck, RN, CRRN     Current Status/Progress Goal Weekly Team Focus  Medical   Gait disorder secondary to multiple sclerosis newly diagnosed with exacerbation  Improve mobility, safety, HTN, bowel/bladder  See above   Bowel/Bladder   Continent of bowel and bladder, last BM 10/25/2016, was medicated with Sorbitol without result, given pruine juice without result  Treat constipation  Bowel management   Swallow/Nutrition/ Hydration             ADL's   Pt  performs UB selfcare in sitting with supervision.  She needs mod assist for LB selfcare with min assist for transfers to and from the toilet  supervision overall  selfcare retraining, balance retraining, functional transfers, therapeutic exercise,  pt/family education   Mobility   min-mod A  mod I-supervision  functional mobility, NMR, coordination, standing balance, activity tolerance, pt education, DC planning   Communication             Safety/Cognition/ Behavioral Observations  Free of fall  Fall precautions and preventions  Remains fall free while in the rehab   Pain   Denies pain   Assess for pain every shift  Pain and discomfort   Skin   No skin issues  Prevent skin breakdown  Assess skin qshift      *See Care Plan and progress notes for long and short-term goals.  Barriers to Discharge: Mobiliy, safety, HTN, neurogenic bowel/bladder, steroid induced hyperglycemia    Possible Resolutions to Barriers:  Therapies, adjust BP meds, adjust bowel/bladder meds, follow FSBGs    Discharge Planning/Teaching Needs:  Either home back with cousin or going to uncle's in [place] he is only able to provide supervision and has to run errands at times.      Team Discussion:  Goals supervision-mod/I level. BERG 21/56. Working on balance issues. Poor foot clearance-cues to clear feet. BP elevated adjusting meds. Neurogenic bowel and bladder-MD monitoring. Checking PVR's. Monitoring labs per MD.  Revisions to Treatment Plan:  Dc 4/27   Continued Need for Acute Rehabilitation  Level of Care: The patient requires daily medical management by a physician with specialized training in physical medicine and rehabilitation for the following conditions: Daily direction of a multidisciplinary physical rehabilitation program to ensure safe treatment while eliciting the highest outcome that is of practical value to the patient.: Yes Daily medical management of patient stability for increased activity during  participation in an intensive rehabilitation regime.: Yes Daily analysis of laboratory values and/or radiology reports with any subsequent need for medication adjustment of medical intervention for : Neurological problems;Blood pressure problems;Other  DupreeLemar Livings 10/31/2016, 3:04 PM

## 2016-10-31 NOTE — Progress Notes (Signed)
Physical Therapy Session Note  Patient Details  Name: Brittany Hartman MRN: [id number] Date of Birth: [birth year]  Today's Date: 10/31/2016 PT Individual Time: 6720-9470 and 1500-1608 PT Individual Time Calculation (min): 58 min and 68 min  Short Term Goals: Week 1:  PT Short Term Goal 1 (Week 1): = LTGs due to anticipated LOS  Skilled Therapeutic Interventions/Progress Updates:   Treatment 1: Patient resting in wheelchair upon arrival. Performed UB dressing in wheelchair with setup assist and supervision and requested to toilet. Performed sit > stand from wheelchair with mod A and verbal cues for pushing up from wheelchair instead of pulling on RW. Gait to and from bathroom using RW with min A. Patient stood using grab bar with supervision to don brief with total A and sat on toilet for LB dressing with setup assist and supervision. Patient stood at sink for hand hygiene with supervision. Gait training using RW x 150 ft + 25 ft with close supervision and focuse on B heel strike due to decreased hip/knee flexion and foot clearance BLE. Patient demonstrates high fall risk as noted by score of 21/56 on Berg Balance Scale. Focus on eccentric control with sit > stand with min A faded to supervision. Performed NuStep using BLE only at level 3 x 5 min for BLE NMR and reciprocal pattern retraining as well as endurance. Patient fatigued easily on NuStep and required verbal cues for pacing. Patient became overheated and provided supine rest break with cool washcloth. Patient required mod A for sit > supine and min A for supine > sit, max A for sit > stand and min A to return to wheelchair using RW. Patient demonstrated strong posterior lean when seated EOM at end of session and required max multimodal cues for upright posture to prevent from sliding off of seat. Patient left sitting in wheelchair with all needs within reach.   Treatment 2: Session focused on switching out and adjusting wheelchair with rigid back  for improved sitting tolerance and improved postural control due to strong posterior lean in sitting, stair training up/down 12 (6") stairs using 2 rails with steady assist-supervision, seated kinetron at 10 cm/sec for 5 trials of 60 sec each progressed to standing kinetron for 3 trials of 20-30 sec each for BLE NMR and weight shifting, functional transfers using RW with max verbal cues for safe hand placement and variable level of assist from supervision-mod A. Patient fatigued and required frequent rest breaks throughout session. Patient left sitting in wheelchair with all needs within reach.   Therapy Documentation Precautions:  Precautions Precautions: Fall Restrictions Weight Bearing Restrictions: No Pain: Pain Assessment Pain Assessment: No/denies pain  See Function Navigator for Current Functional Status.   Therapy/Group: Individual Therapy  Eriona Kinchen, Prudencio Pair 10/31/2016, 10:06 AM

## 2016-10-31 NOTE — Progress Notes (Signed)
[facility] PHYSICAL MEDICINE & REHABILITATION     PROGRESS NOTE  Subjective/Complaints:  Pt seen laying in bed this AM.  She states she had a tiring day yesterday and slept well overnight.  She complains of constipation.  ROS: +Constipation. Denies CP, SOB, N/V/D.  Objective: Vital Signs: Blood pressure (!) 140/91, pulse 64, temperature 98.3 F (36.8 C), temperature source Oral, resp. rate 18, weight 120.1 kg (264 lb 12.4 oz), SpO2 99 %. No results found.  Recent Labs  10/29/16 0524 10/30/16 0629  WBC 6.5 5.8  HGB 14.4 13.8  HCT 44.4 42.0  PLT 230 213    Recent Labs  10/29/16 0524 10/30/16 0629  NA 142 142  K 3.8 3.7  CL 109 109  GLUCOSE 144* 123*  BUN 21* 21*  CREATININE 1.14* 1.00  CALCIUM 8.7* 8.6*   CBG (last 3)   Recent Labs  10/30/16 0637 10/30/16 1130 10/30/16 1610  GLUCAP 116* 89 110*    Wt Readings from Last 3 Encounters:  10/31/16 120.1 kg (264 lb 12.4 oz)  10/25/16 120.8 kg (266 lb 4.8 oz)  10/18/16 102.1 kg (225 lb)    Physical Exam:  BP (!) 140/91 (BP Location: Left Arm)   Pulse 64   Temp 98.3 F (36.8 C) (Oral)   Resp 18   Wt 120.1 kg (264 lb 12.4 oz)   SpO2 99%   BMI 42.74 kg/m  Constitutional: She appears well-developed. Obese  HENT: Normocephalic and atraumatic.  Eyes: EOMI. No discharge.  Cardiovascular: RRR.  No JVD. Respiratory: Effort normal and breath sounds normal.  GI: Soft. Bowel sounds are normal.  Musculoskeletal: She exhibits edema. She exhibits no tenderness.  Neurological: She is alert and oriented.  Motor: 5/5 throughout (stable) Skin: Skin is warm and dry.  Psychiatric: She has a normal mood and affect. Her behavior is normal.   Assessment/Plan: 1. Functional deficits secondary to multiple sclerosis newly diagnosed with exacerbation which require 3+ hours per day of interdisciplinary therapy in a comprehensive inpatient rehab setting. Physiatrist is providing close team supervision and 24 hour management of  active medical problems listed below. Physiatrist and rehab team continue to assess barriers to discharge/monitor patient progress toward functional and medical goals.  Function:  Bathing Bathing position   Position: Shower  Bathing parts Body parts bathed by patient: Right arm, Left arm, Chest, Abdomen, Front perineal area, Buttocks, Right upper leg, Left upper leg, Right lower leg, Left lower leg Body parts bathed by helper: Back  Bathing assist Assist Level: Touching or steadying assistance(Pt > 75%)      Upper Body Dressing/Undressing Upper body dressing   What is the patient wearing?: Bra, Pull over shirt/dress Bra - Perfomed by patient: Thread/unthread right bra strap, Thread/unthread left bra strap, Hook/unhook bra (pull down sports bra)   Pull over shirt/dress - Perfomed by patient: Thread/unthread right sleeve, Thread/unthread left sleeve, Put head through opening          Upper body assist        Lower Body Dressing/Undressing Lower body dressing   What is the patient wearing?: Pants, Non-skid slipper socks     Pants- Performed by patient: Pull pants up/down Pants- Performed by helper: Thread/unthread right pants leg, Thread/unthread left pants leg   Non-skid slipper socks- Performed by helper: Don/doff right sock, Don/doff left sock                  Lower body assist Assist for lower body dressing:  (MAX A)  Toileting Toileting   Toileting steps completed by patient: Adjust clothing prior to toileting, Performs perineal hygiene, Adjust clothing after toileting   Toileting Assistive Devices: Grab bar or rail  Toileting assist Assist level: Touching or steadying assistance (Pt.75%)   Transfers Chair/bed transfer   Chair/bed transfer method: Ambulatory, Stand pivot Chair/bed transfer assist level: Touching or steadying assistance (Pt > 75%) Chair/bed transfer assistive device: Patent attorney     Max distance: 60 ft Assist  level: Moderate assist (Pt 50 - 74%)   Wheelchair   Type: Manual Max wheelchair distance: 50 ft Assist Level: Supervision or verbal cues  Cognition Comprehension Comprehension assist level: Follows basic conversation/direction with no assist  Expression Expression assist level: Expresses basic needs/ideas: With no assist  Social Interaction Social Interaction assist level: Interacts appropriately with others with medication or extra time (anti-anxiety, antidepressant).  Problem Solving Problem solving assist level: Solves basic 75 - 89% of the time/requires cueing 10 - 24% of the time  Memory Memory assist level: Recognizes or recalls 75 - 89% of the time/requires cueing 10 - 24% of the time    Medical Problem List and Plan: 1.  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   Continue prednisone 60 mg daily and taper over 12 days total (to be completed ~4/28)  Cont CIR 2.  DVT Prophylaxis/Anticoagulation: Subcutaneous Lovenox. Monitor for any bleeding episodes.   Vascular study neg 4/17 3. Pain Management: Baclofen 10 mg twice a day 4. Mood: Provide emotional support 5. Neuropsych: This patient is capable of making decisions on her own behalf. 6. Skin/Wound Care: Routine skin checks 7. Fluids/Electrolytes/Nutrition: Routine I&Os 8. Neurogenic bowel and bladder.   Check PVR 3.   Bowel reg increased 4/18 9. Hypertension. Norvasc 10 mg daily  Hydralazine increased to 75mg  on 4/18  10. CKD:   Cr 1.00 on 4/17  Cont to monitor 11. Steroid induced hyperglycemia: SSI  Overall controlled 4/18 12. Morbid obesity  Body mass index is 42.74 kg/m.  Diet and exercise education  Encourage weight loss to increase endurance and promote overall health 13. Hypoalbuminemia  Supplement initiated 4/17  LOS (Days) 2 A FACE TO FACE EVALUATION WAS PERFORMED  Ankit Karis Juba 10/31/2016 7:43 AM

## 2016-10-31 NOTE — Progress Notes (Signed)
Occupational Therapy Session Note  Patient Details  Name: Shavonne Bort MRN: [id number] Date of Birth: 08/28/1965  Today's Date: 10/31/2016 OT Individual Time: 8864-8472 OT Individual Time Calculation (min): 59 min    Short Term Goals: Week 1:  OT Short Term Goal 1 (Week 1): STGs=LTG d/t ELOS  Skilled Therapeutic Interventions/Progress Updates:    Pt completed wheelchair mobility with increased time and supervision during session. Worked on sit to stand transitions and toilet transfers as well at min to mod assist level.  She needs max demonstrational cueing for correct weightshift forward during sit to stand transitions.  Mod instructional cueing for use of the walker and to reach back to the chair when attempting to sit as well.  Pt left in wheelchair at end of session with call button and phone in reach.    Therapy Documentation Precautions:  Precautions Precautions: Fall Restrictions Weight Bearing Restrictions: No  Pain: Pain Assessment Pain Assessment: No/denies pain ADL: See Function Navigator for Current Functional Status.   Therapy/Group: Individual Therapy  Kynzi Levay OTR/L  10/31/2016, 12:35 PM

## 2016-10-31 NOTE — Progress Notes (Addendum)
Social Work Patient ID: Brittany Hartman, female   DOB: 1966-01-04, 51 y.o.   MRN: [id number]  Met with pt to discuss team conference goals supervision-mod/I level and target discharge date 4/27. She is pleased with How well she is doing. She will let her family know of this date. She did talk with [facility] counselor regarding starting SSD and medicaid. She will assist her with this. Will work on discharge needs. Have neuro-psych see for coping and new diagnosis.

## 2016-11-01 ENCOUNTER — Inpatient Hospital Stay (HOSPITAL_COMMUNITY): Payer: Self-pay | Admitting: Occupational Therapy

## 2016-11-01 ENCOUNTER — Inpatient Hospital Stay (HOSPITAL_COMMUNITY): Payer: Self-pay | Admitting: Physical Therapy

## 2016-11-01 MED ORDER — PREDNISONE 20 MG PO TABS
40.0000 mg | ORAL_TABLET | Freq: Every evening | ORAL | Status: DC
  Administered 2016-11-01 – 2016-11-04 (×4): 40 mg via ORAL
  Filled 2016-11-01 (×4): qty 2

## 2016-11-01 NOTE — Progress Notes (Signed)
Physical Therapy Session Note  Patient Details  Name: Brittany Hartman MRN: [id number] Date of Birth: 1965/11/03  Today's Date: 11/01/2016 PT Individual Time: 1347-1416 PT Individual Time Calculation (min): 29 min   Short Term Goals: Week 1:  PT Short Term Goal 1 (Week 1): = LTGs due to anticipated LOS  Skilled Therapeutic Interventions/Progress Updates:    Pt sitting in recliner upon arrival, agreeable to PT session. Ambulating 110 ft with rw f/b seated rest and 60 ft following. Pt using rw and cue provided for posture and rw use. Pt with increasing trunk flexion and increased dependence on UE with fatigue. Standing balance activities performed including static standing eyes open/closed, 3 inch step toe taps bilaterally, floor target foot placement with balance holds as tolerated. Pt returned to room via w/c after session and transferred to recliner per request. Pt with all needs in reach and brother present.   Therapy Documentation Precautions:  Precautions Precautions: Fall Restrictions Weight Bearing Restrictions: No Pain:  Denies pain  See Function Navigator for Current Functional Status.   Therapy/Group: Individual Therapy  Delton See, PT 11/01/2016, 4:01 PM

## 2016-11-01 NOTE — Progress Notes (Signed)
[facility] PHYSICAL MEDICINE & REHABILITATION     PROGRESS NOTE  Subjective/Complaints:  Pt seen laying in bed, talking on the phone this AM.  She slept well overnight.  She notes improvement with bowel/bladder, confirmed with nursing.   ROS: Denies CP, SOB, N/V/D.  Objective: Vital Signs: Blood pressure 121/71, pulse 70, temperature 98.7 F (37.1 C), temperature source Oral, resp. rate 18, weight 120.1 kg (264 lb 12.4 oz), SpO2 100 %. No results found.  Recent Labs  10/30/16 0629  WBC 5.8  HGB 13.8  HCT 42.0  PLT 213    Recent Labs  10/30/16 0629  NA 142  K 3.7  CL 109  GLUCOSE 123*  BUN 21*  CREATININE 1.00  CALCIUM 8.6*   CBG (last 3)   Recent Labs  10/30/16 0637 10/30/16 1130 10/30/16 1610  GLUCAP 116* 89 110*    Wt Readings from Last 3 Encounters:  10/31/16 120.1 kg (264 lb 12.4 oz)  10/25/16 120.8 kg (266 lb 4.8 oz)  10/18/16 102.1 kg (225 lb)    Physical Exam:  BP 121/71 (BP Location: Left Arm)   Pulse 70   Temp 98.7 F (37.1 C) (Oral)   Resp 18   Wt 120.1 kg (264 lb 12.4 oz)   SpO2 100%   BMI 42.74 kg/m  Constitutional: She appears well-developed. Obese  HENT: Normocephalic and atraumatic.  Eyes: EOMI. No discharge.  Cardiovascular: RRR.  No JVD. Respiratory: Effort normal and breath sounds normal.  GI: Soft. Bowel sounds are normal.  Musculoskeletal: She exhibits edema. She exhibits no tenderness.  Neurological: She is alert and oriented.  Motor: 4+-5/5 throughout (unchanged, right stronger than left) Skin: Skin is warm and dry.  Psychiatric: She has a normal mood and affect. Her behavior is normal.   Assessment/Plan: 1. Functional deficits secondary to multiple sclerosis newly diagnosed with exacerbation which require 3+ hours per day of interdisciplinary therapy in a comprehensive inpatient rehab setting. Physiatrist is providing close team supervision and 24 hour management of active medical problems listed below. Physiatrist and  rehab team continue to assess barriers to discharge/monitor patient progress toward functional and medical goals.  Function:  Bathing Bathing position   Position: Shower  Bathing parts Body parts bathed by patient: Right arm, Left arm, Chest, Abdomen, Front perineal area, Buttocks, Right upper leg, Left upper leg, Right lower leg, Left lower leg Body parts bathed by helper: Back  Bathing assist Assist Level: Touching or steadying assistance(Pt > 75%)      Upper Body Dressing/Undressing Upper body dressing   What is the patient wearing?: Bra, Pull over shirt/dress Bra - Perfomed by patient: Thread/unthread right bra strap, Thread/unthread left bra strap, Hook/unhook bra (pull down sports bra)   Pull over shirt/dress - Perfomed by patient: Thread/unthread right sleeve, Thread/unthread left sleeve, Put head through opening, Pull shirt over trunk          Upper body assist Assist Level: Set up, Supervision or verbal cues      Lower Body Dressing/Undressing Lower body dressing   What is the patient wearing?: Pants     Pants- Performed by patient: Thread/unthread right pants leg, Thread/unthread left pants leg, Pull pants up/down Pants- Performed by helper: Thread/unthread right pants leg, Thread/unthread left pants leg   Non-skid slipper socks- Performed by helper: Don/doff right sock, Don/doff left sock                  Lower body assist Assist for lower body dressing: Supervision or  verbal cues      Toileting Toileting   Toileting steps completed by patient: Adjust clothing prior to toileting, Performs perineal hygiene, Adjust clothing after toileting   Toileting Assistive Devices: Grab bar or rail  Toileting assist Assist level: Touching or steadying assistance (Pt.75%)   Transfers Chair/bed transfer   Chair/bed transfer method: Stand pivot Chair/bed transfer assist level: Maximal assist (Pt 25 - 49%/lift and lower) Chair/bed transfer assistive device: Armrests,  Patent attorney     Max distance: 150 ft Assist level: Supervision or verbal cues   Wheelchair   Type: Manual Max wheelchair distance: 50 ft Assist Level: Supervision or verbal cues  Cognition Comprehension Comprehension assist level: Follows basic conversation/direction with no assist  Expression Expression assist level: Expresses basic needs/ideas: With no assist  Social Interaction Social Interaction assist level: Interacts appropriately with others with medication or extra time (anti-anxiety, antidepressant).  Problem Solving Problem solving assist level: Solves basic 75 - 89% of the time/requires cueing 10 - 24% of the time  Memory Memory assist level: Recognizes or recalls 75 - 89% of the time/requires cueing 10 - 24% of the time    Medical Problem List and Plan: 1.  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   Continue prednisone daily and taper over 12 days total (to be completed ~4/28)  Cont CIR 2.  DVT Prophylaxis/Anticoagulation: Subcutaneous Lovenox. Monitor for any bleeding episodes.   Vascular study neg 4/17 3. Pain Management: Baclofen 10 mg twice a day 4. Mood: Provide emotional support 5. Neuropsych: This patient is capable of making decisions on her own behalf. 6. Skin/Wound Care: Routine skin checks 7. Fluids/Electrolytes/Nutrition: Routine I&Os 8. Neurogenic bowel and bladder.   Check PVR 3, appear to be within acceptable range.   Bowel reg increased 4/18 9. Hypertension. Norvasc 10 mg daily  Hydralazine increased to 75mg  on 4/18   Improved 4/19 10. CKD:   Cr 1.00 on 4/17  Cont to monitor 11. Steroid induced hyperglycemia: SSI  Overall controlled 4/19 12. Morbid obesity  Body mass index is 42.74 kg/m.  Diet and exercise education  Encourage weight loss to increase endurance and promote overall health 13. Hypoalbuminemia  Supplement initiated 4/17  LOS (Days) 3 A FACE TO FACE EVALUATION WAS  PERFORMED  Cadi Rhinehart Karis Juba 11/01/2016 8:44 AM

## 2016-11-01 NOTE — Progress Notes (Signed)
Social Work Patient ID: Brittany Hartman, female   DOB: 09-26-1965, 51 y.o.   MRN: [id number]  Met with pt and her brother who is here to assist pt with her plans. He has attended therapies with her and has a better understanding of her  Function. He was thinking she would need to go to a facility, but informed him she was not eligible due to not having insurance. Discussed she will only require supervision level due to doing well. He plans to being here in the am to Talk with MD. Will contact health facilities close to where pt is going to stay to see if can find any therapy for her that offers charity care. Will see brother in the am. Pt to apply for SSD via telephone and Medicaid.

## 2016-11-01 NOTE — Plan of Care (Signed)
Problem: RH BOWEL ELIMINATION Goal: RH STG MANAGE BOWEL W/MEDICATION W/ASSISTANCE STG Manage Bowel with Medication with min  Assistance.   Outcome: Progressing Given senna 1 tablet as ordered  Problem: RH SAFETY Goal: RH STG DECREASED RISK OF FALL WITH ASSISTANCE STG Decreased Risk of Fall With  Mod I .   Outcome: Progressing No fall this shift, patient calling for assistance  Problem: Pain Managment: Goal: General experience of comfort will improve Outcome: Progressing Denies pain and discomfort

## 2016-11-01 NOTE — Progress Notes (Signed)
Occupational Therapy Session Note  Patient Details  Name: Brittany Hartman MRN: [id number] Date of Birth: [birth year]  Today's Date: 11/01/2016 OT Individual Time: 1601-0932 OT Individual Time Calculation (min): 55 min    Short Term Goals: Week 1:  OT Short Term Goal 1 (Week 1): STGs=LTG d/t ELOS  Skilled Therapeutic Interventions/Progress Updates:    1:1 self care retraining shower level with focus on standing balance, recall of hand positioning, controlled decents, functional ambulation with RW. Pt required max to total cues throughout session for proper placement of hands for sit to stands (wanting to always pull up on RW). Pt required mod VC for sequencing to ensure washing all parts in shower. Pt continues to require min A for standing balance with at least one UE support during bathing periarea and clothing management. Pt with poor carry over in session and RW safety requiring mod A. Instructional cues for adaptive techniques for LB dressing. Pt also demonstrates delayed to absent balance reactions with LB in standing without UE support. Pt performed tooth brushing in standing with min A with heavily reliance on sink for support.  Left in recliner to rest at end of session   Therapy Documentation Precautions:  Precautions Precautions: Fall Restrictions Weight Bearing Restrictions: No Pain: Pain Assessment Pain Assessment: No/denies pain  See Function Navigator for Current Functional Status.   Therapy/Group: Individual Therapy  Roney Mans [facility] 11/01/2016, 11:25 AM

## 2016-11-01 NOTE — Progress Notes (Signed)
Physical Therapy Session Note  Patient Details  Name: Brittany Hartman MRN: [id number] Date of Birth: [birth year]  Today's Date: 11/01/2016 PT Individual Time: 1000-1055 and 1530-1610 PT Individual Time Calculation (min): 55 min and 40 min  Short Term Goals: Week 1:  PT Short Term Goal 1 (Week 1): = LTGs due to anticipated LOS  Skilled Therapeutic Interventions/Progress Updates:   Treatment 1: Patient in recliner upon arrival. Patient transferred sit <> stand transfers using RW with supervision. Gait training using RW x 100 ft + 50 ft with close supervision and patient demonstrating excessive B lateral trunk/hip rotation with verbal cues for B heel strike to improve BLE clearance. Gait limited by patient c/o BUE being tired. Sit <> stand from mat table without UE support x 10 with supervision. Standing using RW for BUE support, performed alternating step taps to 4" step with focus on controlled movement and decreasing dependency on BUE for standing balance. In standing, completed pipetree puzzle with UE support as needed with supervision on firm surface. Performed static standing on foam surface with RW with close supervision and without RW with absent balance strategies and min-mod A overall until patient lost balance posteriorly and was lowered to mat table with total A. Patient ambulated back towards room about 50 ft using RW with supervision until she was unable to clear LLE. Patient transferred from wheelchair to recliner with supervision and left sitting in recliner with all needs within reach.   Treatment 2: Patient in recliner with brother present to observe therapies. Education provided to patient and brother regarding anticipated need for supervision at time of discharge for safety, DME needs including heavy duty RW and wheelchair, and plan for HEP for balance and strength, both verbalized understanding. Brother reports feeling better about patient not going to a nursing home after seeing that  she is doing better than he thought. Patient propelled wheelchair on rehab unit including on/off elevators and over thresolds 150 ft with supervision and increased time with verbal cues for technique. Gait training using RW outside on uneven brick and concrete surfaces and inclines/declines up to 100 ft with close supervision and 2 standing rest breaks due to BUE fatigue. Patient negotiated up/down 8 outdoor steps using 1 rail with supervision descending and min A ascending. Patient removed leg rests from wheelchair with supervision and required cues to lock brakes before attempting to stand. Performed stand pivot transfers without AD with close supervision and heavy dependency on armrests. Patient left sitting in recliner with needs in reach and brother present.   Therapy Documentation Precautions:  Precautions Precautions: Fall Restrictions Weight Bearing Restrictions: No Pain: Pain Assessment Pain Assessment: No/denies pain   See Function Navigator for Current Functional Status.   Therapy/Group: Individual Therapy  Dmya Long, Prudencio Pair 11/01/2016, 12:23 PM

## 2016-11-02 ENCOUNTER — Inpatient Hospital Stay (HOSPITAL_COMMUNITY): Payer: Self-pay | Admitting: Occupational Therapy

## 2016-11-02 ENCOUNTER — Inpatient Hospital Stay (HOSPITAL_COMMUNITY): Payer: Self-pay | Admitting: Physical Therapy

## 2016-11-02 ENCOUNTER — Inpatient Hospital Stay (HOSPITAL_COMMUNITY): Payer: Self-pay | Admitting: Speech Pathology

## 2016-11-02 NOTE — Progress Notes (Signed)
Occupational Therapy Session Note  Patient Details  Name: Brittany Hartman MRN: [id number] Date of Birth: [birth year]  Today's Date: 11/02/2016 OT Individual Time: 4098-1191 and 4782-9562 OT Individual Time Calculation (min): 60 min and 30 min   Short Term Goals: Week 1:  OT Short Term Goal 1 (Week 1): STGs=LTG d/t ELOS    Skilled Therapeutic Interventions/Progress Updates: Skilled OT session completed with focus on adaptive bathing/dressing skills, endurance, functional transfers, and walker safety. Pt was lying in bed with brother present, requesting shower. Pt ambulated with RW and Min A to sink to brush teeth first, then she ambulated into bathroom to shower bench. Bathing completed with overall Min A. She was able to complete pericare while standing.  Afterwards dressing was completed at EOB. Safety concerns with pt leaning forward for footwear (unable to achieve figure 4 position). Had her finish dressing while seated in supported chair with LEs elevated on adaptive foostool. Pt able to pull her leg into figure 4 from this elevated position to don socks safely. Assist required for shoes. She then transferred to w/c and was left with all needs within reach.   Throughout session pt required cues for proper hand placement during sit<stands, walker safety,  and problem solving.   2nd Session 1:1 tx (30 min) Pt was sitting in recliner at time of arrival, reported great deal of fatigue from previous therapies. She requested for therapy in room today. Tx focus on UE/LE strengthening. After pt was instructed on UE yoga stretches, she completed exercises with green theraband x10 reps in all planes. Max cues for carryover of proper techniques with ST recall deficits noted. Used bilateral LEs for stabilizing band during bicep/deltoid exercises with band often popping upwards due to LE weakness. At end of tx pt was left with all needs within reach.      Therapy Documentation Precautions:   Precautions Precautions: Fall Restrictions Weight Bearing Restrictions: No   Pain: No c/o pain during sessions  Pain Assessment Pain Assessment: No/denies pain Pain Score: 0-No pain ADL:      See Function Navigator for Current Functional Status.   Therapy/Group: Individual Therapy  Marca Gadsby A Ader Fritze 11/02/2016, 12:17 PM

## 2016-11-02 NOTE — Progress Notes (Signed)
Physical Therapy Session Note  Patient Details  Name: Brittany Hartman MRN: [id number] Date of Birth: 11-Jul-1966  Today's Date: 11/02/2016 PT Individual Time: 0900-1000 PT Individual Time Calculation (min): 60 min   Short Term Goals: Week 1:  PT Short Term Goal 1 (Week 1): = LTGs due to anticipated LOS  Skilled Therapeutic Interventions/Progress Updates:   Patient in wheelchair reporting fatigue upon arrival. Sit <> stand transfers using RW with supervision and min cues for hand placement. Gait training using heavy duty RW x 150 ft with close supervision and min A x 1 for LOB due to narrow BOS. Patient requires max verbal cues for B heel strike due to decreased foot clearance bilaterally and keeping within RW. Performed NuStep at level 5 using BUE/BLE for first 5 min and BLE only for last 5 min for BLE NMR and muscular endurance. Stair training up/down 12 (6") stairs using 2 rails with close supervision. Patient ambulated to ADL apartment to practice ambulation in home carpeted environment with decreased B foot clearance despite max cues. Performed bed mobility on regular bed in ADL apartment x 2 with supervision, increased difficulty with sit > supine but improved with repetition. Performed furniture transfer from low compliant surface with supervision. Patient ambulated back to room using RW with decreased B foot clearance due to fatigue and multipe cues to stop and restart in order to gain control due to increased unsafe speed. Patient left sitting in recliner with all needs within reach.   Therapy Documentation Precautions:  Precautions Precautions: Fall Restrictions Weight Bearing Restrictions: No Pain: Pain Assessment Pain Assessment: No/denies pain Pain Score: 0-No pain  See Function Navigator for Current Functional Status.   Therapy/Group: Individual Therapy  Rogelio Winbush, Prudencio Pair 11/02/2016, 11:15 AM

## 2016-11-02 NOTE — Progress Notes (Signed)
[facility] PHYSICAL MEDICINE & REHABILITATION     PROGRESS NOTE  Subjective/Complaints:  No issues overnite.   ROS: Denies CP, SOB, N/V/D.  Objective: Vital Signs: Blood pressure 128/82, pulse 87, temperature 98 F (36.7 C), temperature source Oral, resp. rate 17, weight 120.1 kg (264 lb 12.4 oz), SpO2 98 %. No results found. No results for input(s): WBC, HGB, HCT, PLT in the last 72 hours. No results for input(s): NA, K, CL, GLUCOSE, BUN, CREATININE, CALCIUM in the last 72 hours.  Invalid input(s): CO CBG (last 3)   Recent Labs  10/30/16 1130 10/30/16 1610  GLUCAP 89 110*    Wt Readings from Last 3 Encounters:  10/31/16 120.1 kg (264 lb 12.4 oz)  10/25/16 120.8 kg (266 lb 4.8 oz)  10/18/16 102.1 kg (225 lb)    Physical Exam:  BP 128/82 (BP Location: Left Arm)   Pulse 87   Temp 98 F (36.7 C) (Oral)   Resp 17   Wt 120.1 kg (264 lb 12.4 oz)   SpO2 98%   BMI 42.74 kg/m  Constitutional: She appears well-developed. Obese  HENT: Normocephalic and atraumatic.  Eyes: EOMI. No discharge.  Cardiovascular: RRR.  No JVD. Respiratory: Effort normal and breath sounds normal.  GI: Soft. Bowel sounds are normal.  Musculoskeletal: She exhibits edema. She exhibits no tenderness.  Neurological: She is alert and oriented.  Motor: 4+-5/5 throughout (unchanged, right stronger than left) Skin: Skin is warm and dry.  Psychiatric: She has a normal mood and affect. Her behavior is normal.   Assessment/Plan: 1. Functional deficits secondary to multiple sclerosis newly diagnosed with exacerbation which require 3+ hours per day of interdisciplinary therapy in a comprehensive inpatient rehab setting. Physiatrist is providing close team supervision and 24 hour management of active medical problems listed below. Physiatrist and rehab team continue to assess barriers to discharge/monitor patient progress toward functional and medical goals.  Function:  Bathing Bathing position    Position: Shower  Bathing parts Body parts bathed by patient: Right arm, Left arm, Chest, Abdomen, Front perineal area, Buttocks, Right upper leg, Left upper leg, Right lower leg, Left lower leg Body parts bathed by helper: Back  Bathing assist Assist Level: Touching or steadying assistance(Pt > 75%)      Upper Body Dressing/Undressing Upper body dressing   What is the patient wearing?: Bra, Pull over shirt/dress Bra - Perfomed by patient: Thread/unthread right bra strap, Thread/unthread left bra strap, Hook/unhook bra (pull down sports bra)   Pull over shirt/dress - Perfomed by patient: Thread/unthread right sleeve, Thread/unthread left sleeve, Put head through opening, Pull shirt over trunk          Upper body assist Assist Level: Supervision or verbal cues      Lower Body Dressing/Undressing Lower body dressing   What is the patient wearing?: Pants, Socks, Shoes     Pants- Performed by patient: Thread/unthread right pants leg, Thread/unthread left pants leg, Pull pants up/down Pants- Performed by helper: Thread/unthread right pants leg, Thread/unthread left pants leg   Non-skid slipper socks- Performed by helper: Don/doff right sock, Don/doff left sock Socks - Performed by patient: Don/doff right sock, Don/doff left sock   Shoes - Performed by patient: Fasten right, Fasten left Shoes - Performed by helper: Don/doff right shoe, Don/doff left shoe          Lower body assist Assist for lower body dressing: Touching or steadying assistance (Pt > 75%)      Toileting Toileting   Toileting steps  completed by patient: Adjust clothing prior to toileting, Performs perineal hygiene, Adjust clothing after toileting Toileting steps completed by helper: Adjust clothing prior to toileting, Performs perineal hygiene, Adjust clothing after toileting Toileting Assistive Devices: Grab bar or rail  Toileting assist Assist level: Touching or steadying assistance (Pt.75%)    Transfers Chair/bed transfer   Chair/bed transfer method: Ambulatory Chair/bed transfer assist level: Supervision or verbal cues Chair/bed transfer assistive device: Armrests, Patent attorney     Max distance: 110 ft Assist level: Supervision or verbal cues   Wheelchair   Type: Manual Max wheelchair distance: 150 ft Assist Level: Supervision or verbal cues  Cognition Comprehension Comprehension assist level: Follows basic conversation/direction with no assist  Expression Expression assist level: Expresses basic needs/ideas: With no assist  Social Interaction Social Interaction assist level: Interacts appropriately with others with medication or extra time (anti-anxiety, antidepressant).  Problem Solving Problem solving assist level: Solves basic problems with no assist  Memory Memory assist level: Recognizes or recalls 75 - 89% of the time/requires cueing 10 - 24% of the time    Medical Problem List and Plan: 1.  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   Continue prednisone daily and taper over 12 days total (to be completed ~4/28)  Cont CIR PT,OT 2.  DVT Prophylaxis/Anticoagulation: Subcutaneous Lovenox. Monitor for any bleeding episodes.   Vascular study neg 4/17 3. Pain Management: Baclofen 10 mg twice a day 4. Mood: Provide emotional support 5. Neuropsych: This patient is capable of making decisions on her own behalf. 6. Skin/Wound Care: Routine skin checks 7. Fluids/Electrolytes/Nutrition: Routine I&Os 8. Neurogenic bowel and bladder.   Check PVR 3, appear to be within acceptable range.   Bowel reg increased 4/18 9. Hypertension. Norvasc 10 mg daily  Hydralazine increased to 75mg  on 4/18   Improved 4/20 Vitals:   11/01/16 2145 11/02/16 0443  BP: 131/71 128/82  Pulse: 78 87  Resp:  17  Temp:  98 F (36.7 C)   10. CKD:   Cr 1.00 on 4/17  Cont to monitor 11. Steroid induced hyperglycemia: SSI  Overall  controlled 4/19 12. Morbid obesity  Body mass index is 42.74 kg/m.  Diet and exercise education  Encourage weight loss to increase endurance and promote overall health 13. Hypoalbuminemia  Supplement initiated 4/17  LOS (Days) 4 A FACE TO FACE EVALUATION WAS PERFORMED  Erick Colace 11/02/2016 8:55 AM

## 2016-11-02 NOTE — Progress Notes (Signed)
Social Work Patient ID: Brittany Hartman, female   DOB: 06-22-1966, 51 y.o.   MRN: [id number] Met with pt to see if brother had any other questions, she reported he went back to [facility] and was ery pleased with her care and Her progress. He was able to see her in therapies yesterday and this calmed his fears of her being discharged to elderly mom's friends home. Pt feels good about her progress and her rehab and how it is going. Will work on discharge plans and see if any charity care where pt is staying in [place].

## 2016-11-02 NOTE — Evaluation (Signed)
Speech Language Pathology Assessment and Plan  Patient Details  Name: Brittany Hartman MRN: [id number] Date of Birth: 31-Jan-1966  SLP Diagnosis: Cognitive Impairments  Rehab Potential: Excellent ELOS: 7 days with ST services    Today's Date: 11/02/2016 SLP Individual Time: 1400-1500 SLP Individual Time Calculation (min): 60 min   Problem List:  Patient Active Problem List   Diagnosis Date Noted  . Multiple sclerosis exacerbation ([place]) 10/30/2016  . Stage 2 chronic kidney disease   . Morbid obesity ([place])   . Hypoalbuminemia due to protein-calorie malnutrition ([place])   . MS (multiple sclerosis) ([place])   . Neurologic gait disorder   . Muscle spasm   . Neurogenic bowel   . Neurogenic bladder   . Benign essential HTN   . Steroid-induced hyperglycemia   . Stage 3 chronic kidney disease   . Multiple sclerosis ([place]) 10/24/2016  . Essential hypertension 10/24/2016  . Acute cystitis with hematuria 10/24/2016   Past Medical History:  Past Medical History:  Diagnosis Date  . Hypertension    Past Surgical History: History reviewed. No pertinent surgical history.  Assessment / Plan / Recommendation Clinical Impression Brittany McKinnonis a 51 y.o.right handed femalewith history of hypertension. History taken from chart review and patient. Presented 10/24/2016 with progressive gait dysfunction over the past 6 years and recent increased falls. Per chart review patient lives with cousin independent prior to admission. 2 level home with bed and bath upstairs.Her cousin works during the day with family can check on her as needed.MRI spine reviewed with patient showing C/T-spine lesions. Per report, CT/MRI showed severe chronic supra-and infratentorial demyelination with superimposed hyperacute acute demyelinating lesions including left superior cerebellar peduncle. Neurology consulted placed on intravenous Solu-MedrolAnd transitioned to prednisone 60 mg daily and tapering over 12 days. MRI  cervical and thoracic spine showed extensive patchy signal abnormalities throughout the cervical and thoracic spine consistent with demyelinating disease. There was some additional small disc protrusions at C2-3, C3-4, C4-5 and C6-7 without significant stenosis. Subcutaneous Lovenox for DVT prophylaxis. Physical and occupational therapy evaluation completed with recommendations of physical medicine rehabilitation consult.  Patient was admitted for a comprehensive rehabilitation program. Pt admited to CIR on 10/29/16. Cognitive linguistic evaluation requested d/t deficits observed within PT and OT sessions. Cognitive linguistic evaluation completed on 11/02/16. Pt demonstrates moderate cognitive impairments as evidenced by score of 18 out of 30 points on MOCA version 8.1 (n=>26). Pt with deficits in area of recall of orientation information, problem solving, sequencing tasks and awareness. Skilled ST is reuqired to target these deficits, increase functional independence and reduce caregiver burden prior to discharge. Anticipate that pt will need 24 hour supervision and follow-up ST services at discharge.    Skilled Therapeutic Interventions          Skilled treatment session focused on completion of cognitive linguistic evaluation, see above. Goals and POC established, pt agreeable.    SLP Assessment  Patient will need skilled [facility] during CIR admission    Recommendations  Recommendations for Other Services: Neuropsych consult Patient destination: Home (TBD) Follow up Recommendations: Home Health SLP;24 hour supervision/assistance;Skilled Nursing facility Equipment Recommended: None recommended by SLP    SLP Frequency 3 to 5 out of 7 days   SLP Duration  SLP Intensity  SLP Treatment/Interventions 7 days with ST services  Minumum of 1-2 x/day, 30 to 90 minutes  Cognitive remediation/compensation;Cueing hierarchy;Environmental controls;Functional tasks;Medication  managment;Patient/family education    Pain    Prior Functioning Cognitive/Linguistic Baseline: Within functional limits Type  of Home: Apartment  Lives With: Family Available Help at Discharge: Family;Available PRN/intermittently Education: High school Vocation: Unemployed  Function:  Eating Eating   Modified Consistency Diet: No Eating Assist Level: More than reasonable amount of time           Cognition Comprehension Comprehension assist level: Follows basic conversation/direction with no assist  Expression   Expression assist level: Expresses basic needs/ideas: With no assist  Social Interaction Social Interaction assist level: Interacts appropriately with others with medication or extra time (anti-anxiety, antidepressant).  Problem Solving Problem solving assist level: Solves basic problems with no assist  Memory Memory assist level: Recognizes or recalls 75 - 89% of the time/requires cueing 10 - 24% of the time   Short Term Goals: Week 1: SLP Short Term Goal 1 (Week 1): Pt will complete semi-complex medication management tasks with supervision cues.  SLP Short Term Goal 2 (Week 1): Pt will complete semi-complex money management tasks with supervision cues.  SLP Short Term Goal 3 (Week 1): Pt will utilize external memory aids to organize and recall new daily information with supervision cues.  SLP Short Term Goal 4 (Week 1): Pt will state 3 activities that are safe for her to perform at next venue of care with supervision cues.  SLP Short Term Goal 5 (Week 1): Pt will perform semi-complex time management/organization tasks such as scheduling or calendar tasks with supervision cues.   Refer to Care Plan for Long Term Goals  Recommendations for other services: Neuropsych  Discharge Criteria: Patient will be discharged from SLP if patient refuses treatment 3 consecutive times without medical reason, if treatment goals not met, if there is a change in medical status, if  patient makes no progress towards goals or if patient is discharged from hospital.  The above assessment, treatment plan, treatment alternatives and goals were discussed and mutually agreed upon: by patient   Mariama Saintvil B. Rutherford Nail, M.S., CCC-SLP Speech-Language Pathologist   Jose Alleyne 11/02/2016, 4:27 PM

## 2016-11-03 ENCOUNTER — Inpatient Hospital Stay (HOSPITAL_COMMUNITY): Payer: Self-pay | Admitting: Speech Pathology

## 2016-11-03 DIAGNOSIS — E669 Obesity, unspecified: Secondary | ICD-10-CM

## 2016-11-03 NOTE — Progress Notes (Signed)
Speech Language Pathology Daily Session Note  Patient Details  Name: Brittany Hartman MRN: [id number] Date of Birth: Jul 23, 1965  Today's Date: 11/03/2016 SLP Individual Time: 1430-1455 SLP Individual Time Calculation (min): 25 min  Short Term Goals: Week 1: SLP Short Term Goal 1 (Week 1): Pt will complete semi-complex medication management tasks with supervision cues.  SLP Short Term Goal 2 (Week 1): Pt will complete semi-complex money management tasks with supervision cues.  SLP Short Term Goal 3 (Week 1): Pt will utilize external memory aids to organize and recall new daily information with supervision cues.  SLP Short Term Goal 4 (Week 1): Pt will state 3 activities that are safe for her to perform at next venue of care with supervision cues.  SLP Short Term Goal 5 (Week 1): Pt will perform semi-complex time management/organization tasks such as scheduling or calendar tasks with supervision cues.   Skilled Therapeutic Interventions: Skilled treatment session focused on cognitive goals. SLP facilitated session by providing Min A verbal cues for recall of her current medications and their functions. A visual aid was created in order to maximize recall and carryover of information. Patient also independently walked clinician through her process for money management with utilization of the automated telephone bank service. Patient left supine in bed with all needs within reach. Continue with current plan of care.      Function:    Cognition Comprehension Comprehension assist level: Follows basic conversation/direction with no assist  Expression   Expression assist level: Expresses basic needs/ideas: With no assist  Social Interaction Social Interaction assist level: Interacts appropriately with others with medication or extra time (anti-anxiety, antidepressant).  Problem Solving Problem solving assist level: Solves basic problems with no assist  Memory Memory assist level: Recognizes or  recalls 75 - 89% of the time/requires cueing 10 - 24% of the time    Pain No/Denies Pain   Therapy/Group: Individual Therapy  Rilie Glanz 11/03/2016, 3:12 PM

## 2016-11-03 NOTE — Progress Notes (Signed)
Patient ID: Brittany Hartman, female   DOB: 07-02-1966, 51 y.o.   MRN: [id number]   11/03/16.  Brittany Hartman is a 51 y.o. female who is admitted for CIR with Gait disorder secondary to multiple sclerosis newly diagnosed with exacerbation.    Past Medical History:  Diagnosis Date  . Hypertension     Subjective: No new complaints. No new problems.  Please with progress.  Using walker.  Remains weak  Objective: Vital signs in last 24 hours: Temp:  [97.6 F (36.4 C)-98.3 F (36.8 C)] 97.6 F (36.4 C) (04/21 0459) Pulse Rate:  [71-76] 73 (04/21 0459) Resp:  [18] 18 (04/21 0459) BP: (138-144)/(76-79) 144/79 (04/21 0459) SpO2:  [98 %-99 %] 98 % (04/21 0459) Weight change:  Last BM Date: 10/31/16  Intake/Output from previous day: 04/20 0701 - 04/21 0700 In: 960 [P.O.:960] Out: -  Last cbgs: CBG (last 3)  No results for input(s): GLUCAP in the last 72 hours.   Physical Exam General: No apparent distress   HEENT: not dry Lungs: Normal effort. Lungs clear to auscultation, no crackles or wheezes. Cardiovascular: Regular rate and rhythm, no edema Abdomen: S/NT/ND; BS(+) Musculoskeletal:  Negative Neurological: No new neurological deficits; generalized weakness; slightly stronger on the right Wounds: N/A    Skin: clear   Mental state: Alert, oriented, cooperative    Lab Results: BMET    Component Value Date/Time   NA 142 10/30/2016 0629   K 3.7 10/30/2016 0629   CL 109 10/30/2016 0629   CO2 24 10/30/2016 0629   GLUCOSE 123 (H) 10/30/2016 0629   BUN 21 (H) 10/30/2016 0629   CREATININE 1.00 10/30/2016 0629   CALCIUM 8.6 (L) 10/30/2016 0629   GFRNONAA >60 10/30/2016 0629   GFRAA >60 10/30/2016 0629   CBC    Component Value Date/Time   WBC 5.8 10/30/2016 0629   RBC 4.61 10/30/2016 0629   HGB 13.8 10/30/2016 0629   HCT 42.0 10/30/2016 0629   PLT 213 10/30/2016 0629   MCV 91.1 10/30/2016 0629   MCH 29.9 10/30/2016 0629   MCHC 32.9 10/30/2016 0629   RDW 15.0  10/30/2016 0629   LYMPHSABS 0.9 10/30/2016 0629   MONOABS 0.4 10/30/2016 0629   EOSABS 0.0 10/30/2016 0629   BASOSABS 0.0 10/30/2016 0629    Medications: I have reviewed the patient's current medications.  BP Readings from Last 3 Encounters:  11/03/16 (!) 144/79  10/29/16 138/68  10/19/16 (!) 178/99   CBG (last 3)  No results for input(s): GLUCAP in the last 72 hours.   Assessment/Plan:  Exacerbation multiple sclerosis with functional deficits due to weakness and gait instability Hypertension.  Reasonably well-controlled.  Continue present regimen Stress/steroid-induced hyperglycemia.  Continue monitoring and SSI coverage Obesity- diet and exercise education    Length of stay, days: 5  Rogelia Boga , MD 11/03/2016, 9:08 AM

## 2016-11-04 ENCOUNTER — Inpatient Hospital Stay (HOSPITAL_COMMUNITY): Payer: Self-pay | Admitting: Occupational Therapy

## 2016-11-04 ENCOUNTER — Inpatient Hospital Stay (HOSPITAL_COMMUNITY): Payer: Self-pay

## 2016-11-04 NOTE — Progress Notes (Signed)
Occupational Therapy Session Note  Patient Details  Name: Cossette Gatson MRN: [id number] Date of Birth: [birth year]  Today's Date: 11/04/2016 OT Individual Time: 7510-2585 and 2778-2423 OT Individual Time Calculation (min): 59 min and 73 min    Short Term Goals: Week 1:  OT Short Term Goal 1 (Week 1): STGs=LTG d/t ELOS     Skilled Therapeutic Interventions/Progress Updates: Skilled OT session completed with focus on adaptive bathing/dressing skills, safety awareness, endurance and balance. Pt was lying in bed at time of arrival, requesting shower. She ambulated with RW to brush teeth at sink with min guard then proceeded into bathroom to shower. Bathing completed while seated and standing PRN with grab bars. Tub bench oriented facing grab bars for safety. 1 forward LOB while washing L LE when it was abducted to side of bench.   She required therapist correction. Afterwards pt completed dressing w/c level at sink using sink as balance support when bending forward. Max cues for proper hand placement during sit<stands. Teds donned with Total A, she reports her brother will assist with this at discharge. Footwear donned with LEs elevated on bed. At end of tx pt was left in w/c with all needs within reach.   Discussed having family present for family education this week. She planned to call them today to ask about available dates as they live in [place].   2nd Session 1:1 tx (73 min) Skilled OT session completed with focus on IADL retraining, dynamic balance, and walker safety. Pt was sitting in w/c at time of arrival after lunch. She ambulated to therapy apartment with min guard-supervision and RW, slightly veering to left side of hallway. She engaged in simulated meal prep by gathering items from everyday routine and simulating actual preparation. Pt required supervision and Mod cues for safe walker placement and energy conservation techniques. Afterwards, had her engage in bedmaking task for balance,  cognition, and continued walker safety. Able to use bilateral UEs to tuck sheets and orient linen. Pt with 2 small LOBs (once leaning forward and once with static standing rest break). OT assisted with correction. Pt reported her legs often feel "numb" with prolonged standing. Openly discussed her current deficits and importance of having insight to know when she needs to sit down (and telling others). Pt very involved in discussion and reported that she will acknowledge limits at home with standing/ambulating. She self propelled back to room for UB strengthening. Cues for avoiding obstacles on left side. Once in room, pt was left with all needs within reach.   Therapy Documentation Precautions:  Precautions Precautions: Fall Restrictions Weight Bearing Restrictions: No   Vital Signs: Therapy Vitals Pulse Rate: 69 BP: (!) 146/74 Pain: No c/o pain during sessions  Pain Assessment Pain Assessment: No/denies pain ADL:      See Function Navigator for Current Functional Status.   Therapy/Group: Individual Therapy  Taylynn Easton A Lorrene Graef 11/04/2016, 12:20 PM

## 2016-11-04 NOTE — Progress Notes (Signed)
Patient ID: Brittany Hartman, female   DOB: 06-09-1966, 51 y.o.   MRN: [id number]   11/04/16.  Brittany Hartman is a 51 y.o. female with a history of newly diagnosed multiple sclerosis with recent exacerbation.  She is admitted for CIR with deconditioning and gait disorder secondary to MS.  Subjective: No new complaints.  Remains weak with poor balance, uses a walker  Past Medical History:  Diagnosis Date  . Hypertension      Objective: Vital signs in last 24 hours: Temp:  [97.6 F (36.4 C)-98 F (36.7 C)] 98 F (36.7 C) (04/22 0446) Pulse Rate:  [67-83] 67 (04/22 0446) Resp:  [18] 18 (04/22 0446) BP: (131-147)/(75-89) 143/82 (04/22 0446) SpO2:  [95 %-100 %] 95 % (04/22 0446) Weight change:  Last BM Date: 11/03/16  Intake/Output from previous day: No intake/output data recorded.   Physical Exam General: No apparent distress   HEENT: not dry Lungs: Normal effort. Lungs clear to auscultation, no crackles or wheezes. Cardiovascular: Regular rate and rhythm, no edema Abdomen: S/NT/ND; BS(+) Musculoskeletal:  unchanged Neurological: No new neurological deficits; remains weak.  Slightly stronger on the right Wounds: N/A    Skin: clear  Mental state: Alert, oriented, cooperative  BP Readings from Last 3 Encounters:  11/04/16 (!) 143/82  10/29/16 138/68  10/19/16 (!) 178/99    Lab Results: BMET    Component Value Date/Time   NA 142 10/30/2016 0629   K 3.7 10/30/2016 0629   CL 109 10/30/2016 0629   CO2 24 10/30/2016 0629   GLUCOSE 123 (H) 10/30/2016 0629   BUN 21 (H) 10/30/2016 0629   CREATININE 1.00 10/30/2016 0629   CALCIUM 8.6 (L) 10/30/2016 0629   GFRNONAA >60 10/30/2016 0629   GFRAA >60 10/30/2016 0629   CBC    Component Value Date/Time   WBC 5.8 10/30/2016 0629   RBC 4.61 10/30/2016 0629   HGB 13.8 10/30/2016 0629   HCT 42.0 10/30/2016 0629   PLT 213 10/30/2016 0629   MCV 91.1 10/30/2016 0629   MCH 29.9 10/30/2016 0629   MCHC 32.9 10/30/2016 0629   RDW 15.0 10/30/2016 0629   LYMPHSABS 0.9 10/30/2016 0629   MONOABS 0.4 10/30/2016 0629   EOSABS 0.0 10/30/2016 0629   BASOSABS 0.0 10/30/2016 0629    Medications: I have reviewed the patient's current medications.  Assessment/Plan:  Status post MS exacerbation with functional deficits of weakness and unsteady gait Hypertension.  Continue present regimen Obesity.  Continue exercise and dietary education History of stress/steroid hyperglycemia.  Continue to monitor    Length of stay, days: 6  Rogelia Boga , MD 11/04/2016, 8:23 AM

## 2016-11-04 NOTE — Progress Notes (Addendum)
Physical Therapy Note  Patient Details  Name: Brittany Hartman MRN: [id number] Date of Birth: 02-15-1966 Today's Date: 11/04/2016  tx 1: 1135-1205, 30 min individual tx Pain:none per pt  w/c propulsion via bil UEs x 100' before fatigueing, supervision.  PT provided cues for improved efficiency, but pt unable to remember to lift hands off rims q push.  Gait on level tile x 75' to corner steps. Up/down 12 steps 2 rails with min guard assist, cues for sequncing. neuromuscular re-education via multimodal cues and demo for 20 x 1 each bil hip adduction and hip internal rotation, pelvic tilts, sustained stretch bil heel cords in standing on wedge, with bil UE support on RW, x 2 minutes without LOB.  Pt able to balance briefly without UE support, on wedge. Pt left resting in w/c with all needs within reach, family present.  tx 2: 1535-1605, 30 min   Pt appeared exhausted, and stated she was exhausted. She has been sitting up since 8am.   Pt attempting to pour drink and manipulate meds with fingers, but was clumsy and spilled.  PT educated pt on need for rest in bed each day, with a nap if possible, to avoid exhaustion and ensuing fall risk. Seated neuro re-ed:  bil hip adduction against resistance, bil glut sets, trunk flex/extension, x 20 each.  Standing : 10 x 1 mini squats, heel raises, with bil UE support on RW.  Transferred to bed at end of session. Pt left resting in bed with all needs within reach, bed alarm set.  See function navigator for current status.   [name] 11/04/2016, 8:27 AM

## 2016-11-05 ENCOUNTER — Inpatient Hospital Stay (HOSPITAL_COMMUNITY): Payer: Self-pay | Admitting: Physical Therapy

## 2016-11-05 ENCOUNTER — Inpatient Hospital Stay (HOSPITAL_COMMUNITY): Payer: Self-pay | Admitting: Occupational Therapy

## 2016-11-05 ENCOUNTER — Inpatient Hospital Stay (HOSPITAL_COMMUNITY): Payer: Self-pay | Admitting: Speech Pathology

## 2016-11-05 MED ORDER — PREDNISONE 20 MG PO TABS
20.0000 mg | ORAL_TABLET | Freq: Every evening | ORAL | Status: DC
  Administered 2016-11-05 – 2016-11-06 (×2): 20 mg via ORAL
  Filled 2016-11-05 (×2): qty 1

## 2016-11-05 NOTE — Progress Notes (Signed)
Physical Therapy Session Note  Patient Details  Name: Brittany Hartman MRN: [id number] Date of Birth: 1966-06-21  Today's Date: 11/05/2016 PT Individual Time: 1130-1200 PT Individual Time Calculation (min): 30 min    Therapy Documentation Precautions:  Precautions Precautions: Fall Restrictions Weight Bearing Restrictions: No Vital Signs: Therapy Vitals BP: (!) 142/85 Pain: Patient denies any pain   Sit to and from stand transfer RW supervision, verbal cues for RW management  Ambulatory transfer with RW supervision. Verbal cues sequence and technique.   Patient ambulated with RW to and from simulated car transfer 50 feetx2 with RW supervision. Verbal; cues for pacing, upright posture.  Session focused on car transfer to height of SUV Patient performed three trials with use of RW. Patient provided education and visual demonstration for proper sequence and technique. Patient educated on pacing and slowing down the transfer to improve safety and success to a higher surface. Patient did require min assist for car transfer for initial two trials however progress to close supervision on third and final trial.   Patient tolerated session well and was concerned getting to her brother's SUV. Patient educated to ask brother exact height of SUV in order to improve practice techniques and strategies here. Patient returned to room at end of session with all needs met.    Therapy/Group: Individual Therapy  Retta Diones 11/05/2016, 12:10 PM

## 2016-11-05 NOTE — Progress Notes (Signed)
Speech Language Pathology Daily Session Note  Patient Details  Name: Brittany Hartman MRN: [id number] Date of Birth: January 31, 1966  Today's Date: 11/05/2016 SLP Individual Time: 1515-1600 SLP Individual Time Calculation (min): 45 min  Short Term Goals: Week 1: SLP Short Term Goal 1 (Week 1): Pt will complete semi-complex medication management tasks with supervision cues.  SLP Short Term Goal 2 (Week 1): Pt will complete semi-complex money management tasks with supervision cues.  SLP Short Term Goal 3 (Week 1): Pt will utilize external memory aids to organize and recall new daily information with supervision cues.  SLP Short Term Goal 4 (Week 1): Pt will state 3 activities that are safe for her to perform at next venue of care with supervision cues.  SLP Short Term Goal 5 (Week 1): Pt will perform semi-complex time management/organization tasks such as scheduling or calendar tasks with supervision cues.   Skilled Therapeutic Interventions: Skilled treatment session focused on cognition goals. SLP facilitated session by providing Mod A to complete grocery store list for specified number of people and money. Pt also required Max A faded to Mod A for completion of simple deductive reasoning puzzle. Pt was returned to room, left upright in wheelchair and all needs within reach. Continue per current plan of care.      Function:    Cognition Comprehension Comprehension assist level: Follows basic conversation/direction with no assist  Expression   Expression assist level: Expresses basic needs/ideas: With no assist  Social Interaction Social Interaction assist level: Interacts appropriately 90% of the time - Needs monitoring or encouragement for participation or interaction.  Problem Solving Problem solving assist level: Solves basic 75 - 89% of the time/requires cueing 10 - 24% of the time  Memory Memory assist level: Recognizes or recalls 75 - 89% of the time/requires cueing 10 - 24% of the time     Pain Pain Assessment Pain Assessment: No/denies pain  Therapy/Group: Individual Therapy   Brittany Hartman B. Brittany Hartman, M.S., CCC-SLP Speech-Language Pathologist   Brittany Hartman 11/05/2016, 3:59 PM

## 2016-11-05 NOTE — Progress Notes (Signed)
[place] PHYSICAL MEDICINE & REHABILITATION     PROGRESS NOTE  Subjective/Complaints:  Pt seen sitting up in her chair this AM.  She states she is doing well and had a good weekend.  Her brother has questions and she calls him on the phone.   ROS: Denies CP, SOB, N/V/D.  Objective: Vital Signs: Blood pressure 138/77, pulse 90, temperature 98 F (36.7 C), temperature source Oral, resp. rate 18, weight 120.1 kg (264 lb 12.4 oz), SpO2 100 %. No results found. No results for input(s): WBC, HGB, HCT, PLT in the last 72 hours. No results for input(s): NA, K, CL, GLUCOSE, BUN, CREATININE, CALCIUM in the last 72 hours.  Invalid input(s): CO CBG (last 3)  No results for input(s): GLUCAP in the last 72 hours.  Wt Readings from Last 3 Encounters:  10/31/16 120.1 kg (264 lb 12.4 oz)  10/25/16 120.8 kg (266 lb 4.8 oz)  10/18/16 102.1 kg (225 lb)    Physical Exam:  BP 138/77 (BP Location: Left Arm)   Pulse 90   Temp 98 F (36.7 C) (Oral)   Resp 18   Wt 120.1 kg (264 lb 12.4 oz)   SpO2 100%   BMI 42.74 kg/m  Constitutional: She appears well-developed. Obese  HENT: Normocephalic and atraumatic.  Eyes: EOMI. No discharge.  Cardiovascular: RRR.  No JVD. Respiratory: Effort normal and breath sounds normal.  GI: Soft. Bowel sounds are normal.  Musculoskeletal: She exhibits edema. She exhibits no tenderness.  Neurological: She is alert and oriented.  Motor: 4+-5/5 throughout (stable, right stronger than left) Skin: Skin is warm and dry.  Psychiatric: She has a normal mood and affect. Her behavior is normal.   Assessment/Plan: 1. Functional deficits secondary to multiple sclerosis newly diagnosed with exacerbation which require 3+ hours per day of interdisciplinary therapy in a comprehensive inpatient rehab setting. Physiatrist is providing close team supervision and 24 hour management of active medical problems listed below. Physiatrist and rehab team continue to assess barriers to  discharge/monitor patient progress toward functional and medical goals.  Function:  Bathing Bathing position   Position: Shower  Bathing parts Body parts bathed by patient: Right arm, Left arm, Chest, Abdomen, Front perineal area, Buttocks, Right upper leg, Left upper leg, Right lower leg, Left lower leg Body parts bathed by helper: Back  Bathing assist Assist Level: Touching or steadying assistance(Pt > 75%)      Upper Body Dressing/Undressing Upper body dressing   What is the patient wearing?: Bra, Pull over shirt/dress Bra - Perfomed by patient: Thread/unthread right bra strap, Thread/unthread left bra strap, Hook/unhook bra (pull down sports bra)   Pull over shirt/dress - Perfomed by patient: Thread/unthread right sleeve, Thread/unthread left sleeve, Put head through opening, Pull shirt over trunk          Upper body assist Assist Level: Set up      Lower Body Dressing/Undressing Lower body dressing   What is the patient wearing?: Pants, Non-skid slipper socks, Ted Hose     Pants- Performed by patient: Thread/unthread right pants leg, Thread/unthread left pants leg, Pull pants up/down Pants- Performed by helper: Thread/unthread right pants leg, Thread/unthread left pants leg Non-skid slipper socks- Performed by patient: Don/doff right sock, Don/doff left sock (with LEs elevated on bed) Non-skid slipper socks- Performed by helper: Don/doff right sock, Don/doff left sock Socks - Performed by patient: Don/doff right sock, Don/doff left sock   Shoes - Performed by patient: Fasten right, Fasten left Shoes - Performed by  helper: Don/doff right shoe, Don/doff left shoe, Fasten right, Fasten left       TED Hose - Performed by helper: Don/doff right TED hose, Don/doff left TED hose  Lower body assist Assist for lower body dressing: Touching or steadying assistance (Pt > 75%)      Toileting Toileting Toileting activity did not occur: No continent bowel/bladder event Toileting  steps completed by patient: Adjust clothing prior to toileting, Performs perineal hygiene, Adjust clothing after toileting Toileting steps completed by helper: Adjust clothing prior to toileting, Performs perineal hygiene, Adjust clothing after toileting Toileting Assistive Devices: Grab bar or rail  Toileting assist Assist level: Touching or steadying assistance (Pt.75%)   Transfers Chair/bed transfer   Chair/bed transfer method: Ambulatory Chair/bed transfer assist level: Touching or steadying assistance (Pt > 75%) Chair/bed transfer assistive device: Armrests, Patent attorney     Max distance: 75 Assist level: Supervision or verbal cues   Wheelchair   Type: Manual Max wheelchair distance: 100 Assist Level: Supervision or verbal cues  Cognition Comprehension Comprehension assist level: Follows basic conversation/direction with no assist  Expression Expression assist level: Expresses basic needs/ideas: With no assist  Social Interaction Social Interaction assist level: Interacts appropriately with others with medication or extra time (anti-anxiety, antidepressant).  Problem Solving Problem solving assist level: Solves basic problems with no assist  Memory Memory assist level: Recognizes or recalls 75 - 89% of the time/requires cueing 10 - 24% of the time    Medical Problem List and Plan: 1.  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   Continue prednisone daily and taper, decreased again on 4/23  Cont CIR   Weekend notes reviewed  Will need to inquire about baseline therapy 2.  DVT Prophylaxis/Anticoagulation: Subcutaneous Lovenox. Monitor for any bleeding episodes.   Vascular study neg 4/17 3. Pain Management: Baclofen 10 mg twice a day 4. Mood: Provide emotional support 5. Neuropsych: This patient is capable of making decisions on her own behalf. 6. Skin/Wound Care: Routine skin checks 7.  Fluids/Electrolytes/Nutrition: Routine I&Os 8. Neurogenic bowel and bladder.   PVR 3, acceptable  Bowel reg increased 4/18 9. Hypertension. Norvasc 10 mg daily  Hydralazine increased to 75mg  on 4/18   Controlled 4/21 Vitals:   11/04/16 2025 11/05/16 0438  BP: 130/74 138/77  Pulse: 84 90  Resp: 18 18  Temp:  98 F (36.7 C)   10. CKD:   Cr 1.00 on 4/17  Cont to monitor  Labs ordered for tomorrow 11. Steroid induced hyperglycemia: SSI  Overall controlled 4/19 12. Morbid obesity  Body mass index is 42.74 kg/m.  Diet and exercise education  Encourage weight loss to increase endurance and promote overall health 13. Hypoalbuminemia  Supplement initiated 4/17  >35 minutes spent with patient and brother via phone with >25 minutes spent in education and counseling regarding disease, medications, prognosis  LOS (Days) 7 A FACE TO FACE EVALUATION WAS PERFORMED  Ankit Karis Juba 11/05/2016 9:34 AM

## 2016-11-05 NOTE — Progress Notes (Signed)
Physical Therapy Session Note  Patient Details  Name: Brittany Hartman MRN: [id number] Date of Birth: [birth year]  Today's Date: 11/05/2016 PT Individual Time:  -      Short Term Goals: Week 1:  PT Short Term Goal 1 (Week 1): = LTGs due to anticipated LOS  Skilled Therapeutic Interventions/Progress Updates:    Pt sitting in w/c upon arrival, agreeable to PT session. Ambulating 140 ft using rw with return to room to use bathroom. Supervision <>stand from toilet and performing hygiene with supervision for balance. Transfers performed from w/c, mat table and toilet with supervision. Standing balance activities including 4 inch toe taps, cone taps at variable angles and static standing without UE support X2 min. Ambulation performed at variable distance during session with supervision. Reminder provided to stay inside rw. Also going up/down 12 steps with bilateral rail and supervision. Pt returned to room and sitting in w/c per her request with all needs in reach. Taking rest breaks throughout session as needed, discussed activity pacing with activity multiple times during session.   Therapy Documentation Precautions:  Precautions Precautions: Fall Restrictions Weight Bearing Restrictions: No Pain:  Denies  See Function Navigator for Current Functional Status.   Therapy/Group: Individual Therapy  Delton See, PT 11/05/2016, 10:46 AM

## 2016-11-05 NOTE — Progress Notes (Addendum)
Occupational Therapy Weekly Progress Note  Patient Details  Name: Brittany Hartman MRN: [id number] Date of Birth: 1965-12-14  Beginning of progress report period: October 30, 2016 End of progress report period: November 06, 2015  Today's Date: 11/05/2016 OT Individual Time: 1300-1400 OT Individual Time Calculation (min): 60 min    [name] continues to demonstrate good progress with OT at this time.  Currently, she completes selfcare tasks sit to stand at a min to min guard assist level.  Occasional LOB posteriorly is noted with static standing during weightshifts or reaching, if UEs are not being used to support her.  Min assist for functional transfers to and from the shower as well as the toilet, with use of the RW.  She continues to demonstrate some limited endurance as it relates to selfcare tasks as well, requiring occasional rest breaks but it does not substantially increase the time needed to complete her ADLs.  [facility] where she will have 24 hour supervision.  Recommend continued work toward supervision level goals in preparation for discharge 4/27,  Patient continues to demonstrate the following deficits: muscle weakness, decreased cardiorespiratoy endurance, impaired timing and sequencing and decreased coordination and decreased standing balance, decreased postural control and decreased balance strategies and therefore will continue to benefit from skilled OT intervention to enhance overall performance with BADL, iADL and Reduce care partner burden.  Patient progressing toward long term goals..  Continue plan of care.  OT Short Term Goals Week 2:  OT Short Term Goal 1 (Week 2): STGs=LTG d/t ELOS  Skilled Therapeutic Interventions/Progress Updates:    Pt transferred to the walk-in shower with use of the RW and min assist.  She completed shower sit to stand with min guard assist and use of the grab bar for support when standing to wash and dry her peri  area.  Min instructional cueing for thoroughness and sequencing during shower.  She completed UB dressing sitting on the tub bench and then LB dressing sit to stand on the edge of the bed.  Min assist needed for pulling pants over her brief and hips with increased time and min guard assist for donning shoes and tying them.  Progressed to wheelchair mobility to the therapy gym with slight difficulty and overall min guard assist.  Once in the gym worked on sit to stand and squat to stand transitions without use of UEs and min assist.  Also worked on standing standing balance with slight head turns in all directions.  Posterior LOB noted during these head turns.  Returned to room at end of session via wheelchair with pt left in wheelchair at bedside with call button and phone in reach.   Therapy Documentation Precautions:  Precautions Precautions: Fall Restrictions Weight Bearing Restrictions: No  Pain: Pain Assessment Pain Assessment: No/denies pain ADL: See Function Navigator for Current Functional Status.   Therapy/Group: Individual Therapy  Randel Hargens OTR/L 11/05/2016, 3:54 PM

## 2016-11-05 NOTE — Progress Notes (Signed)
Patient reported that she took herself to the bathroom because she called and staff did not come in time. Educated patient about need to call for assistance and risks of falling. She verbalized cooperation. Will report to oncoming RN.

## 2016-11-06 ENCOUNTER — Inpatient Hospital Stay (HOSPITAL_COMMUNITY): Payer: Self-pay | Admitting: Speech Pathology

## 2016-11-06 ENCOUNTER — Inpatient Hospital Stay (HOSPITAL_COMMUNITY): Payer: Self-pay | Admitting: Physical Therapy

## 2016-11-06 ENCOUNTER — Inpatient Hospital Stay (HOSPITAL_COMMUNITY): Payer: Self-pay

## 2016-11-06 DIAGNOSIS — D72829 Elevated white blood cell count, unspecified: Secondary | ICD-10-CM

## 2016-11-06 DIAGNOSIS — D62 Acute posthemorrhagic anemia: Secondary | ICD-10-CM

## 2016-11-06 LAB — BASIC METABOLIC PANEL
Anion gap: 6 (ref 5–15)
BUN: 19 mg/dL (ref 6–20)
CHLORIDE: 109 mmol/L (ref 101–111)
CO2: 24 mmol/L (ref 22–32)
Calcium: 8.4 mg/dL — ABNORMAL LOW (ref 8.9–10.3)
Creatinine, Ser: 1.09 mg/dL — ABNORMAL HIGH (ref 0.44–1.00)
GFR calc Af Amer: >60 mL/min (ref >60)
GFR, EST NON AFRICAN AMERICAN: 58 mL/min — AB (ref >60)
Glucose, Bld: 157 mg/dL — ABNORMAL HIGH (ref 65–99)
Potassium: 3.7 mmol/L (ref 3.5–5.1)
SODIUM: 139 mmol/L (ref 135–145)

## 2016-11-06 LAB — CBC WITH DIFFERENTIAL/PLATELET
Basophils Absolute: 0 10*3/uL (ref 0.0–0.1)
Basophils Relative: 0 %
EOS ABS: 0 10*3/uL (ref 0.0–0.7)
EOS PCT: 0 %
HCT: 35.8 % — ABNORMAL LOW (ref 36.0–46.0)
Hemoglobin: 11.5 g/dL — ABNORMAL LOW (ref 12.0–15.0)
LYMPHS ABS: 1.5 10*3/uL (ref 0.7–4.0)
Lymphocytes Relative: 14 %
MCH: 29.5 pg (ref 26.0–34.0)
MCHC: 32.1 g/dL (ref 30.0–36.0)
MCV: 91.8 fL (ref 78.0–100.0)
Monocytes Absolute: 0.5 10*3/uL (ref 0.1–1.0)
Monocytes Relative: 5 %
Neutro Abs: 8.8 10*3/uL — ABNORMAL HIGH (ref 1.7–7.7)
Neutrophils Relative %: 81 %
Platelets: 212 10*3/uL (ref 150–400)
RBC: 3.9 MIL/uL (ref 3.87–5.11)
RDW: 15 % (ref 11.5–15.5)
WBC: 10.8 10*3/uL — AB (ref 4.0–10.5)

## 2016-11-06 NOTE — Progress Notes (Signed)
Speech Language Pathology Daily Session Note  Patient Details  Name: Brittany Hartman MRN: [id number] Date of Birth: June 29, 1966  Today's Date: 11/06/2016 SLP Individual Time: 0801-0900 SLP Individual Time Calculation (min): 59 min  Short Term Goals: Week 1: SLP Short Term Goal 1 (Week 1): Pt will complete semi-complex medication management tasks with supervision cues.  SLP Short Term Goal 2 (Week 1): Pt will complete semi-complex money management tasks with supervision cues.  SLP Short Term Goal 3 (Week 1): Pt will utilize external memory aids to organize and recall new daily information with supervision cues.  SLP Short Term Goal 4 (Week 1): Pt will state 3 activities that are safe for her to perform at next venue of care with supervision cues.  SLP Short Term Goal 5 (Week 1): Pt will perform semi-complex time management/organization tasks such as scheduling or calendar tasks with supervision cues.   Skilled Therapeutic Interventions:  Pt was seen for skilled ST targeting cognitive goals.   Pt was awake and alert upon arrival and noted to be slightly impulsive with mobility and rolling walker use during ambulation from bed to toilet.  Pt did request help appropriately when standing up from toilet.  Supervision cues were needed x1 to apply and release brakes when repositioning wheelchair during seated sinkside ADLs versus pushing and tipping chair backwards.  SLP facilitated the session with continued practice of medication management with use of pill box to address problem solving and use of external memory aids. Pt initially needed instruction in use and organization of pill box as it was an unfamiliar task; however, following instruction pt was able to organize pills of varying frequencies into box for 5/6 accuracy, improving to 6/6 accuracy with min assist faded to supervision verbal cues to recognize and correct errors.  During task, SLP also provided and reinforced education regarding memory and  organization compensatory strategies, including checking items off a list once they are completed and separating medication bottles once they have been used.   Pt returned to room and left in wheelchair with call bell within reach.  Continue per current plan of care.       Function:  Eating Eating                 Cognition Comprehension Comprehension assist level: Follows complex conversation/direction with extra time/assistive device  Expression   Expression assist level: Expresses complex ideas: With extra time/assistive device  Social Interaction Social Interaction assist level: Interacts appropriately with others with medication or extra time (anti-anxiety, antidepressant).  Problem Solving Problem solving assist level: Solves basic 75 - 89% of the time/requires cueing 10 - 24% of the time  Memory Memory assist level: Recognizes or recalls 75 - 89% of the time/requires cueing 10 - 24% of the time    Pain Pain Assessment Pain Assessment: No/denies pain  Therapy/Group: Individual Therapy  Azzure Garabedian, Melanee Spry 11/06/2016, 9:13 AM

## 2016-11-06 NOTE — Progress Notes (Signed)
Physical Therapy Session Note  Patient Details  Name: Brittany Hartman MRN: [id number] Date of Birth: [birth year]  Today's Date: 11/06/2016 PT Individual Time: 1020-1100 and 1400-1430 PT Individual Time Calculation (min): 40 min and 30 min  Short Term Goals: Week 1:  PT Short Term Goal 1 (Week 1): = LTGs due to anticipated LOS  Skilled Therapeutic Interventions/Progress Updates:   Treatment 1: Patient in wheelchair upon arrival. Patient elected to get dressed for therapy. Donned brakes with total A and patient required max multimodal cues for removing leg rests. Sit <> stand from wheelchair using RW with verbal cues for safe hand placement. Patient ambulated using RW with cues to roll it instead of pick it up to move forward to retrieve clothing in room and cues to utilize walker bag to transport clothes to bathroom. Performed toileting tasks with supervision and UB dressing seated and LB dressing with cues to sit and thread BLE instead of stand for safety with increased time and supervision. Performed hand hygiene standing at sink with supervision. Gait training using RW x 200 ft with close supervision and mod verbal cues for RW management/safety and B heel strike for improved B foot clearance. Patient required questioning cues for path finding to car simulator. Patient reports her brother's truck seat height is 39 inches. Attempted simulated car transfer to seat height of 39" using step stool to serve as running board. Patient instructed in sequencing and technique for backing up with RW to running board and attempting to scoot back on seat before lifting BLE into car. Patient unable to safely scoot back on seat despite max A and max multimodal cues therefore transfer terminated due to safety concerns. On mat table, instructed patient in reciprocal scooting to practice technique but patient unable to complete without strong posterior lean and rolling side to side. Recommend that brother bring alternative  vehicle for improved safety and energy conservation especially since patient will have to ride in car from [place] to [place] on day of discharge. Discussed plan for family training with patient's brother planned for Friday prior to DC home. Patient left sitting in wheelchair with all needs within reach.   Treatment 2: Patient in wheelchair following OT, reporting fatigue. Patient propelled wheelchair to gym using BUE for strengthening and activity tolerance. Stair training up/down 12 (6") stairs using 2 rails with supervision. Focus on dynamic standing balance with obstacle course negotiating over thresholds and triangle wedges with mod-max A hand held assist. Patient ambulated back toward room x 75 ft + 50 ft with R HHA and min A overall. Patient left sitting in wheelchair with all needs within reach.    Therapy Documentation Precautions:  Precautions Precautions: Fall Restrictions Weight Bearing Restrictions: No Pain: Pain Assessment Pain Assessment: No/denies pain  See Function Navigator for Current Functional Status.   Therapy/Group: Individual Therapy  Shamir Tuzzolino, Prudencio Pair 11/06/2016, 12:18 PM

## 2016-11-06 NOTE — Progress Notes (Signed)
Social Work Patient ID: Brittany Hartman, female   DOB: 27-Aug-1965, 52 y.o.   MRN: [id number] Pt's plan is now to go home with her brother and he is the one coming to transport her home Friday. Therapy told her to have him come by 10:00 Friday to go through therapies and then be able to get on the road. Will gather medical information and disc to take with her. Will order equipment and see if Can find charity care for follow up therapies. Will continue to work on discharge plans.

## 2016-11-06 NOTE — Progress Notes (Signed)
[facility] PHYSICAL MEDICINE & REHABILITATION     PROGRESS NOTE  Subjective/Complaints:  Pt seen laying in bed this AM.  She slept well overnight and does not any any complaints this AM.    ROS: Denies CP, SOB, N/V/D.  Objective: Vital Signs: Blood pressure 122/81, pulse 85, temperature 97.2 F (36.2 C), temperature source Oral, resp. rate 16, weight 120.1 kg (264 lb 12.4 oz), SpO2 99 %. No results found.  Recent Labs  11/06/16 0007  WBC 10.8*  HGB 11.5*  HCT 35.8*  PLT 212    Recent Labs  11/06/16 0007  NA 139  K 3.7  CL 109  GLUCOSE 157*  BUN 19  CREATININE 1.09*  CALCIUM 8.4*   CBG (last 3)  No results for input(s): GLUCAP in the last 72 hours.  Wt Readings from Last 3 Encounters:  10/31/16 120.1 kg (264 lb 12.4 oz)  10/25/16 120.8 kg (266 lb 4.8 oz)  10/18/16 102.1 kg (225 lb)    Physical Exam:  BP 122/81 (BP Location: Left Arm)   Pulse 85   Temp 97.2 F (36.2 C) (Oral)   Resp 16   Wt 120.1 kg (264 lb 12.4 oz)   SpO2 99%   BMI 42.74 kg/m  Constitutional: She appears well-developed. Obese  HENT: Normocephalic and atraumatic.  Eyes: EOMI. No discharge.  Cardiovascular: RRR.  No JVD. Respiratory: Effort normal and breath sounds normal.  GI: Soft. Bowel sounds are normal.  Musculoskeletal: She exhibits edema. She exhibits no tenderness.  Neurological: She is alert and oriented.  Motor: 4+-5/5 throughout (unchanged, right stronger than left) Skin: Skin is warm and dry.  Psychiatric: She has a normal mood and affect. Her behavior is normal.   Assessment/Plan: 1. Functional deficits secondary to multiple sclerosis newly diagnosed with exacerbation which require 3+ hours per day of interdisciplinary therapy in a comprehensive inpatient rehab setting. Physiatrist is providing close team supervision and 24 hour management of active medical problems listed below. Physiatrist and rehab team continue to assess barriers to discharge/monitor patient progress  toward functional and medical goals.  Function:  Bathing Bathing position   Position: Shower  Bathing parts Body parts bathed by patient: Right arm, Left arm, Chest, Abdomen, Front perineal area, Buttocks, Right upper leg, Left upper leg, Right lower leg, Left lower leg Body parts bathed by helper: Back  Bathing assist Assist Level: Touching or steadying assistance(Pt > 75%)      Upper Body Dressing/Undressing Upper body dressing   What is the patient wearing?: Bra, Pull over shirt/dress Bra - Perfomed by patient: Thread/unthread right bra strap, Thread/unthread left bra strap, Hook/unhook bra (pull down sports bra)   Pull over shirt/dress - Perfomed by patient: Thread/unthread right sleeve, Thread/unthread left sleeve, Put head through opening, Pull shirt over trunk          Upper body assist Assist Level: Set up      Lower Body Dressing/Undressing Lower body dressing   What is the patient wearing?: Pants, Non-skid slipper socks, Shoes, [employer]- Performed by patient: Thread/unthread right pants leg, Thread/unthread left pants leg Pants- Performed by helper: Pull pants up/down Non-skid slipper socks- Performed by patient: Don/doff right sock, Don/doff left sock (doff for shower) Non-skid slipper socks- Performed by helper: Don/doff right sock, Don/doff left sock Socks - Performed by patient: Don/doff right sock, Don/doff left sock   Shoes - Performed by patient: Don/doff right shoe, Don/doff left shoe, Fasten right, Fasten left Shoes - Performed  by helper: Don/doff right shoe, Don/doff left shoe, Fasten right, Fasten left       TED Hose - Performed by helper: Don/doff right TED hose, Don/doff left TED hose  Lower body assist Assist for lower body dressing: Touching or steadying assistance (Pt > 75%)      Toileting Toileting Toileting activity did not occur: No continent bowel/bladder event Toileting steps completed by patient: Adjust clothing prior to  toileting, Performs perineal hygiene Toileting steps completed by helper: Adjust clothing after toileting Toileting Assistive Devices: Grab bar or rail  Toileting assist Assist level: Touching or steadying assistance (Pt.75%)   Transfers Chair/bed transfer   Chair/bed transfer method: Ambulatory Chair/bed transfer assist level: Touching or steadying assistance (Pt > 75%) Chair/bed transfer assistive device: Armrests, Patent attorney     Max distance: 140 ft Assist level: Supervision or verbal cues   Wheelchair   Type: Manual Max wheelchair distance: 100 Assist Level: Supervision or verbal cues  Cognition Comprehension Comprehension assist level: Follows basic conversation/direction with no assist  Expression Expression assist level: Expresses basic needs/ideas: With no assist  Social Interaction Social Interaction assist level: Interacts appropriately 90% of the time - Needs monitoring or encouragement for participation or interaction.  Problem Solving Problem solving assist level: Solves basic 75 - 89% of the time/requires cueing 10 - 24% of the time  Memory Memory assist level: Recognizes or recalls 75 - 89% of the time/requires cueing 10 - 24% of the time    Medical Problem List and Plan: 1.  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   Continue prednisone daily and taper, decreased again on 4/23  Cont CIR   Pt to follow up as outpt for initiation of immunomodulating therapy 2.  DVT Prophylaxis/Anticoagulation: Subcutaneous Lovenox. Monitor for any bleeding episodes.   Vascular study neg 4/17 3. Pain Management: Baclofen 10 mg twice a day 4. Mood: Provide emotional support 5. Neuropsych: This patient is capable of making decisions on her own behalf. 6. Skin/Wound Care: Routine skin checks 7. Fluids/Electrolytes/Nutrition: Routine I&Os 8. Neurogenic bowel and bladder.   PVR 3, acceptable  Bowel reg increased  4/18 9. Hypertension. Norvasc 10 mg daily  Hydralazine increased to 75mg  on 4/18   Controlled 4/24 Vitals:   11/05/16 2144 11/06/16 0700  BP: 114/68 122/81  Pulse:  85  Resp:  16  Temp:  97.2 F (36.2 C)   10. CKD:   Cr 1.09 on 4/24  Cont to monitor 11. Steroid induced hyperglycemia: SSI  Overall controlled 4/19 12. Morbid obesity  Body mass index is 42.74 kg/m.  Diet and exercise education  Encourage weight loss to increase endurance and promote overall health 13. Hypoalbuminemia  Supplement initiated 4/17 14. Leukocytosis  WBCs 10.8 on 4/24  Cont to monitor  UA/Ucx ordered 15. ABLA  Hb 11.5 on 4/24  Cont to monitor  LOS (Days) 8 A FACE TO FACE EVALUATION WAS PERFORMED  Machell Wirthlin Karis Juba 11/06/2016 9:04 AM

## 2016-11-06 NOTE — Progress Notes (Signed)
Occupational Therapy Session Note  Patient Details  Name: Brittany Hartman MRN: [id number] Date of Birth: [birth year]  Today's Date: 11/06/2016 OT Individual Time: 1300-1357 OT Individual Time Calculation (min): 57 min    Short Term Goals: Week 1:  OT Short Term Goal 1 (Week 1): STGs=LTG d/t ELOS  Skilled Therapeutic Interventions/Progress Updates:    1:1. No pain reported. Pt agreeable to bathe and dress session. Pt ambulates throughout session with CGA and VC for safety awareness, posture,foot clearance and RW management. Pt transfers onto toilet and voids urine with CGA for balance and VC for safety awareness as pt attempts to walk to TTB with pants around ankles. Pt unable to identify that as fall risk. Pt bathes seated on TTB with supervision-CGA for standing with VC safety awareness. Pt dons UB clothing while sitting in w/c with set up to obtain clothing. Pt dons LB clothing with VC to dress LLE first d/t difficulty moving LLE>RLE (as in hemi dressing). Pt voices difficulty donning socks and shoes. OT educates pt on use of sock aide and shoe funnel. Pt dons B socks/shoes with AE with supervision and VC for technique. Educated pt on importance to wait for nursing to assist with toileting/mobility. Pt stands at sink to brush teeth with CGA. Exited session with pt seated in w/c with call light in reach.    Therapy Documentation Precautions:  Precautions Precautions: Fall Restrictions Weight Bearing Restrictions: No General:   Vital Signs:   Pain: Pain Assessment Pain Assessment: No/denies pain  See Function Navigator for Current Functional Status.   Therapy/Group: Individual Therapy  Shon Hale 11/06/2016, 2:16 PM

## 2016-11-07 ENCOUNTER — Encounter (HOSPITAL_COMMUNITY): Payer: Self-pay | Admitting: Psychology

## 2016-11-07 ENCOUNTER — Inpatient Hospital Stay (HOSPITAL_COMMUNITY): Payer: Self-pay | Admitting: Physical Therapy

## 2016-11-07 ENCOUNTER — Inpatient Hospital Stay (HOSPITAL_COMMUNITY): Payer: Self-pay | Admitting: Occupational Therapy

## 2016-11-07 ENCOUNTER — Inpatient Hospital Stay (HOSPITAL_COMMUNITY): Payer: Self-pay | Admitting: Speech Pathology

## 2016-11-07 DIAGNOSIS — R269 Unspecified abnormalities of gait and mobility: Secondary | ICD-10-CM

## 2016-11-07 LAB — URINALYSIS, COMPLETE (UACMP) WITH MICROSCOPIC
BILIRUBIN URINE: NEGATIVE
GLUCOSE, UA: NEGATIVE mg/dL
Hgb urine dipstick: NEGATIVE
KETONES UR: NEGATIVE mg/dL
NITRITE: NEGATIVE
PROTEIN: NEGATIVE mg/dL
Specific Gravity, Urine: 1.023 (ref 1.005–1.030)
pH: 5 (ref 5.0–8.0)

## 2016-11-07 MED ORDER — OXYBUTYNIN CHLORIDE 5 MG PO TABS
5.0000 mg | ORAL_TABLET | Freq: Three times a day (TID) | ORAL | Status: DC
  Administered 2016-11-07 – 2016-11-09 (×7): 5 mg via ORAL
  Filled 2016-11-07 (×7): qty 1

## 2016-11-07 MED ORDER — PREDNISONE 5 MG PO TABS
10.0000 mg | ORAL_TABLET | Freq: Every evening | ORAL | Status: DC
  Administered 2016-11-07 – 2016-11-08 (×2): 10 mg via ORAL
  Filled 2016-11-07 (×2): qty 2

## 2016-11-07 NOTE — Progress Notes (Signed)
Occupational Therapy Session Note  Patient Details  Name: Brittany Hartman MRN: [id number] Date of Birth: 09/15/1965  Today's Date: 11/07/2016 OT Individual Time: 0800-0900 OT Individual Time Calculation (min): 60 min    Short Term Goals: Week 1:  OT Short Term Goal 1 (Week 1): STGs=LTG d/t ELOS  Skilled Therapeutic Interventions/Progress Updates:    Pt worked on bathing and dressing during session as well as grooming tasks in standing.  Supervision for all transfers to the shower seat and toilet with use of the RW for support.  Mod instructional cueing is still needed for hand placement with sit to stand and stand to sit as she wants to grab hold of the walker instead of pushing up or reaching back to the surface.  Min instructional cueing for safety awareness when reaching down to the floor to donn her shoes and beginning to slide forward on the bed.  She was able to donn and tie them however without use of AE this session.  Close supervision for standing at the sink to comb her hair and brush her teeth.  Ended session with pt in wheelchair with call button and phone in reach.    Therapy Documentation Precautions:  Precautions Precautions: Fall Restrictions Weight Bearing Restrictions: No  Pain: Pain Assessment Pain Assessment: No/denies pain ADL: See Function Navigator for Current Functional Status.   Therapy/Group: Individual Therapy  Dashanique Brownstein OTR/L 11/07/2016, 11:23 AM

## 2016-11-07 NOTE — Plan of Care (Signed)
Problem: RH BOWEL ELIMINATION Goal: RH STG MANAGE BOWEL WITH ASSISTANCE STG Manage Bowel with min  Assistance.   Outcome: Not Progressing LBM 11-04-16

## 2016-11-07 NOTE — Consult Note (Signed)
Neuropsychological Consultation   Patient:   Brittany Hartman   DOB:   October 20, 1965  MR Number:  [id number]  [facility]:  [facility] [id number] [place] [address] Dept: [phone] Loc: [phone]           Date of Service:   11/07/2016  Start Time:   9 AM End Time:   9:58 AM  Provider/Observer:  Arley Phenix, Psy.D.       Clinical Neuropsychologist       Billing Code/Service: [phone] 4 Units  Chief Complaint:    Or patient has been having difficulties with episodes of falling as well as stiffening muscles and legs 4 or 5 years she had an acute worsening recently and radiological studies revealed multiple lesions in her spinal cord consistent with MS.  Reason for Service:  Brittany McKinnonis a 59 y.o. that presented at the emergency department on 10/24/2016 with progressive changes in gait over the past 6 years. There is been an acute worsening that had including numerous falls. MRI of the spine revealed numerous cervical and thoracic lesions indicating both chronic as well as more acute lesioning and demyelinization. There was also some small disc protrusions without indications of stenosis.  Current Status:  The patient has been trying to cope with the recent revelation for her that she likely has multiple sclerosis with primary demyelinization occurring in her spinal cord and cervical and thoracic regions.  Reliability of Information: Information is derived from review of medical records, reviewing case with treatment team and one hour spent with patient.  Behavioral Observation: Brittany Hartman  presents as a 51 y.o.-year-old Right African American Female who appeared her stated age. her dress was Appropriate and she was Well Groomed and her manners were Appropriate to the situation.  her participation was indicative of Appropriate and Attentive behaviors.  There were physical disabilities  noted.  she displayed an appropriate level of cooperation and motivation.     Interactions:    Active Appropriate  Attention:   within normal limits and attention span and concentration were age appropriate  Memory:   within normal limits; recent and remote memory intact  Visuo-spatial:  within normal limits  Speech (Volume):  normal  Speech:   normal; normal  Thought Process:  Coherent and Relevant  Though Content:  WNL; not suicidal  Orientation:   person, place, time/date and situation  Judgment:   Good  Planning:   Good  Affect:    Anxious  Mood:    Anxious  Insight:   Good  Intelligence:   normal  Marital Status/Living: The patient is currently living independently with a cousin on a 2 level home with her bedroom and bathroom on the second floor.  Substance Use:  No concerns of substance abuse are reported.    Medical History:   Past Medical History:  Diagnosis Date  . Hypertension         Family Med/Psych History:  Family History  Problem Relation Age of Onset  . Heart attack Mother   . Hypertension Brother     Risk of Suicide/Violence: virtually non-existent the patient denies any suicidal or homicidal ideation.  Impression/DX:  The patient is a 51 year old female who had a 5 or 6 year history of falling, leg weakness and stiffness in muscles. She initially attributed this to her exercise regimen and her primary care doctor is reported to of told her that it may be due to  arthritis in her knees. However, her falling became more frequent and she continued to have increasing difficulties with her gait. Presentation to the emergency department recently resulted in MRI of her head and neck and numerous older chronic lesions as well as multiple indications of more acute lesioning were found in her cervical and thoracic spine regions. The patient has been trying to cope with this medical information.  Disposition/Plan:  We worked on Producer, television/film/video and  strategies around adjusting to recent medical information and the need for both acute rehabilitation services to aid in safely been able to ambulate and reduce risk of falling as well as a plan for when she returns to [facility] for follow-up care with a neurologist regarding her multiple sclerosis diagnosis.  Diagnosis:    Multiple sclerosis (HCC) - Plan: Ambulatory referral to Neurology         Electronically Signed   _______________________ Arley Phenix, Psy.D.

## 2016-11-07 NOTE — Progress Notes (Signed)
Physical Therapy Weekly Progress Note  Patient Details  Name: Brittany Hartman MRN: [id number] Date of Birth: 02-23-1966  Beginning of progress report period: October 30, 2016 End of progress report period: November 07, 2016  Today's Date: 11/07/2016 PT Individual Time: 1305-1400 PT Individual Time Calculation (min): 55 min   Patient making steady progress this reporting period. She requires supervision using RW overall with heavy dependence on UE support to maintain standing balance. Recommending 24/7 supervision at discharge due to cognitive impairments and safety concerns and will benefit from family training with brother prior to discharge home to [place].   Patient continues to demonstrate the following deficits muscle weakness and muscle joint tightness, decreased cardiorespiratoy endurance, impaired timing and sequencing, abnormal tone, unbalanced muscle activation and decreased coordination, decreased visual spatial awareness, decreased awareness, decreased problem solving, decreased safety awareness, decreased memory and delayed processing and decreased standing balance, decreased postural control and decreased balance strategies and therefore will continue to benefit from skilled PT intervention to increase functional independence with mobility.  Patient progressing toward long term goals.  Continue plan of care.   Skilled Therapeutic Interventions/Progress Updates:   Patient in wheelchair upon arrival, reporting need to void. Patient stood from wheelchair, ambulated to and from bathroom using RW, and performed toileting tasks with supervision. Gait training using RW x 150 ft with close supervision, patient demonstrating improved control and self-monitoring with patient stopping to rest and regain balance as needed. Reassessed Berg Balance Scale, see details below. Patient demonstrates high fall risk as noted by score of 29/56 on Berg Balance Scale, improved from score of 21/56 on 10/31/16.  Patient able to perform standing marching x 10 without UE support with close supervision.   Therapy Documentation Precautions:  Precautions Precautions: Fall Restrictions Weight Bearing Restrictions: No Pain: Pain Assessment Pain Assessment: No/denies pain  See Function Navigator for Current Functional Status.  Therapy/Group: Individual Therapy  Brittany Hartman, Prudencio Pair 11/07/2016, 2:20 PM

## 2016-11-07 NOTE — Progress Notes (Signed)
Speech Language Pathology Daily Session Note  Patient Details  Name: Brittany Hartman MRN: [id number] Date of Birth: 11-26-1965  Today's Date: 11/07/2016 SLP Individual Time: 1005-1100 SLP Individual Time Calculation (min): 55 min  Short Term Goals: Week 1: SLP Short Term Goal 1 (Week 1): Pt will complete semi-complex medication management tasks with supervision cues.  SLP Short Term Goal 2 (Week 1): Pt will complete semi-complex money management tasks with supervision cues.  SLP Short Term Goal 3 (Week 1): Pt will utilize external memory aids to organize and recall new daily information with supervision cues.  SLP Short Term Goal 4 (Week 1): Pt will state 3 activities that are safe for her to perform at next venue of care with supervision cues.  SLP Short Term Goal 5 (Week 1): Pt will perform semi-complex time management/organization tasks such as scheduling or calendar tasks with supervision cues.   Skilled Therapeutic Interventions:  Pt was seen for skilled ST targeting cognitive goals.  Pt was able to return demonstration of applying and releasing brakes on wheelchair appropriately when standing up and sitting down with supervision verbal cues.  Pt could verbalize steps of safe walker use during ambulation but needed supervision verbal cues to follow them in the moment.   SLP facilitated the session with a deductive reasoning task targeting goals for functional problem solving.  Pt was able to recall 2 organizational strategies covered in yesterday's therapy session with supervision question cues and then utilized them in the abovementioned task with overall min assist verbal cues.  Pt reports that brother is aware of family education on Friday 4/27 and is planning to be here.  Pt also reports that between her brother, sister in law, and nieces that she will have 24/7 supervision at discharge.  Pt was returned to room and left with call bell within reach.  Continue per current plan of care.        Function:  Eating Eating                 Cognition Comprehension Comprehension assist level: Follows complex conversation/direction with extra time/assistive device  Expression   Expression assist level: Expresses complex ideas: With extra time/assistive device  Social Interaction Social Interaction assist level: Interacts appropriately with others with medication or extra time (anti-anxiety, antidepressant).  Problem Solving Problem solving assist level: Solves basic 75 - 89% of the time/requires cueing 10 - 24% of the time  Memory Memory assist level: Recognizes or recalls 75 - 89% of the time/requires cueing 10 - 24% of the time    Pain Pain Assessment Pain Assessment: No/denies pain  Therapy/Group: Individual Therapy  Deborh Pense, Melanee Spry 11/07/2016, 10:58 AM

## 2016-11-07 NOTE — Progress Notes (Signed)
Occupational Therapy Session Note  Patient Details  Name: Brittany Hartman MRN: [id number] Date of Birth: [birth year]  Today's Date: 11/07/2016 OT Individual Time: 9024-0973 OT Individual Time Calculation (min): 39 min    Short Term Goals: Week 2:  OT Short Term Goal 1 (Week 2): STGs=LTG d/t ELOS  Skilled Therapeutic Interventions/Progress Updates:    Pt worked on sit to stand and standing balance in the therapy gym while engaged in a functional reaching task.  Occasional LOB posteriorly noted with reaching requiring min assist to self correct when standing on a solid surface.  Had pt stand on inclined surface for reaching task as well.  Mod assist for balance overall with more frequent LOB posteriorly noted.  Pt with decreased ability to forward weightshift to correct balance without mod assistance.  Returned to room at end of session via wheelchair with completion of toileting and toilet transfer using the RW.  Mod assist for peri hygiene secondary to decreased thoroughness.  Finished session with call button and phone in reach.    Therapy Documentation Precautions:  Precautions Precautions: Fall Restrictions Weight Bearing Restrictions: No  Pain: Pain Assessment Pain Assessment: No/denies pain ADL: See Function Navigator for Current Functional Status.   Therapy/Group: Individual Therapy  Sharone Picchi OTR/L 11/07/2016, 4:20 PM

## 2016-11-07 NOTE — Progress Notes (Signed)
[facility] PHYSICAL MEDICINE & REHABILITATION     PROGRESS NOTE  Subjective/Complaints:  Pt seen laying in bed this AM.  She slept well overnight.  She would like the name of a Neurologist in [place] that she may follow up with.   ROS: +Urinary freq. Denies CP, SOB, N/V/D.  Objective: Vital Signs: Blood pressure 115/79, pulse 76, temperature 98.2 F (36.8 C), temperature source Oral, resp. rate 17, weight 123 kg (271 lb 2.7 oz), SpO2 99 %. No results found.  Recent Labs  11/06/16 0007  WBC 10.8*  HGB 11.5*  HCT 35.8*  PLT 212    Recent Labs  11/06/16 0007  NA 139  K 3.7  CL 109  GLUCOSE 157*  BUN 19  CREATININE 1.09*  CALCIUM 8.4*   CBG (last 3)  No results for input(s): GLUCAP in the last 72 hours.  Wt Readings from Last 3 Encounters:  11/07/16 123 kg (271 lb 2.7 oz)  10/25/16 120.8 kg (266 lb 4.8 oz)  10/18/16 102.1 kg (225 lb)    Physical Exam:  BP 115/79 (BP Location: Left Arm)   Pulse 76   Temp 98.2 F (36.8 C) (Oral)   Resp 17   Wt 123 kg (271 lb 2.7 oz)   SpO2 99%   BMI 43.77 kg/m  Constitutional: She appears well-developed. Obese  HENT: Normocephalic and atraumatic.  Eyes: EOMI. No discharge.  Cardiovascular: RRR.  No JVD. Respiratory: Effort normal and breath sounds normal.  GI: Soft. Bowel sounds are normal.  Musculoskeletal: She exhibits edema. She exhibits no tenderness.  Neurological: She is alert and oriented.  Motor: 4+-5/5 throughout (stable, right stronger than left) Skin: Skin is warm and dry.  Psychiatric: She has a normal mood and affect. Her behavior is normal.   Assessment/Plan: 1. Functional deficits secondary to multiple sclerosis newly diagnosed with exacerbation which require 3+ hours per day of interdisciplinary therapy in a comprehensive inpatient rehab setting. Physiatrist is providing close team supervision and 24 hour management of active medical problems listed below. Physiatrist and rehab team continue to assess barriers  to discharge/monitor patient progress toward functional and medical goals.  Function:  Bathing Bathing position   Position: Shower  Bathing parts Body parts bathed by patient: Right arm, Left arm, Chest, Abdomen, Front perineal area, Buttocks, Right upper leg, Left upper leg, Right lower leg, Left lower leg Body parts bathed by helper: Back  Bathing assist Assist Level: Touching or steadying assistance(Pt > 75%)      Upper Body Dressing/Undressing Upper body dressing   What is the patient wearing?: Bra, Pull over shirt/dress Bra - Perfomed by patient: Thread/unthread right bra strap, Thread/unthread left bra strap, Hook/unhook bra (pull down sports bra)   Pull over shirt/dress - Perfomed by patient: Thread/unthread right sleeve, Thread/unthread left sleeve, Put head through opening, Pull shirt over trunk          Upper body assist Assist Level: Supervision or verbal cues   Set up : To obtain clothing/put away  Lower Body Dressing/Undressing Lower body dressing   What is the patient wearing?: Pants, Shoes, Socks     Pants- Performed by patient: Thread/unthread right pants leg, Thread/unthread left pants leg, Pull pants up/down Pants- Performed by helper: Pull pants up/down Non-skid slipper socks- Performed by patient: Don/doff right sock, Don/doff left sock (doff for shower) Non-skid slipper socks- Performed by helper: Don/doff right sock, Don/doff left sock Socks - Performed by patient: Don/doff right sock, Don/doff left sock (sock aide)  Shoes - Performed by patient: Don/doff right shoe, Don/doff left shoe, Fasten right, Fasten left (shoe funnel) Shoes - Performed by helper: Don/doff right shoe, Don/doff left shoe, Fasten right, Fasten left       TED Hose - Performed by helper: Don/doff right TED hose, Don/doff left TED hose  Lower body assist Assist for lower body dressing: Touching or steadying assistance (Pt > 75%)      Toileting Toileting Toileting activity did not  occur: No continent bowel/bladder event Toileting steps completed by patient: Adjust clothing prior to toileting, Performs perineal hygiene, Adjust clothing after toileting Toileting steps completed by helper: Adjust clothing after toileting Toileting Assistive Devices: Grab bar or rail  Toileting assist Assist level: Supervision or verbal cues   Transfers Chair/bed transfer   Chair/bed transfer method: Ambulatory Chair/bed transfer assist level: Supervision or verbal cues Chair/bed transfer assistive device: Armrests, Patent attorney     Max distance: 200 ft Assist level: Supervision or verbal cues   Wheelchair   Type: Manual Max wheelchair distance: 150 ft Assist Level: Supervision or verbal cues  Cognition Comprehension Comprehension assist level: Follows complex conversation/direction with extra time/assistive device  Expression Expression assist level: Expresses complex ideas: With extra time/assistive device  Social Interaction Social Interaction assist level: Interacts appropriately with others with medication or extra time (anti-anxiety, antidepressant).  Problem Solving Problem solving assist level: Solves basic 75 - 89% of the time/requires cueing 10 - 24% of the time  Memory Memory assist level: Recognizes or recalls 75 - 89% of the time/requires cueing 10 - 24% of the time    Medical Problem List and Plan: 1.  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   Continue prednisone daily and taper, decreased again on 4/25  Cont CIR   Pt to follow up as outpt for initiation of immunomodulating therapy - pt would like to follow up with physician in [place], [place]:  Dr. Lulu Riding  [address], [place], [place] [address]  Phone: [phone] 2.  DVT Prophylaxis/Anticoagulation: Subcutaneous Lovenox. Monitor for any bleeding episodes.   Vascular study neg 4/17 3. Pain Management: Baclofen 10 mg twice a day 4.  Mood: Provide emotional support 5. Neuropsych: This patient is capable of making decisions on her own behalf. 6. Skin/Wound Care: Routine skin checks 7. Fluids/Electrolytes/Nutrition: Routine I&Os 8. Neurogenic bowel and bladder.   PVR 3, acceptable  Bowel reg increased 4/18  Ditropan started 4/25 9. Hypertension. Norvasc 10 mg daily  Hydralazine increased to 75mg  on 4/18   Controlled 4/25 10. CKD:   Cr 1.09 on 4/24  Cont to monitor 11. Steroid induced hyperglycemia: SSI  Overall controlled 4/25 12. Morbid obesity  Body mass index is 43.77 kg/m.  Diet and exercise education  Encourage weight loss to increase endurance and promote overall health 13. Hypoalbuminemia  Supplement initiated 4/17 14. Leukocytosis  WBCs 10.8 on 4/24  Cont to monitor  UA/Ucx pending 15. ABLA  Hb 11.5 on 4/24  Cont to monitor  LOS (Days) 9 A FACE TO FACE EVALUATION WAS PERFORMED  Ebrima Ranta Karis Juba 11/07/2016 8:36 AM

## 2016-11-08 ENCOUNTER — Inpatient Hospital Stay (HOSPITAL_COMMUNITY): Payer: Self-pay | Admitting: Physical Therapy

## 2016-11-08 ENCOUNTER — Inpatient Hospital Stay (HOSPITAL_COMMUNITY): Payer: Self-pay | Admitting: Occupational Therapy

## 2016-11-08 ENCOUNTER — Inpatient Hospital Stay (HOSPITAL_COMMUNITY): Payer: Self-pay | Admitting: Speech Pathology

## 2016-11-08 LAB — URINE CULTURE

## 2016-11-08 NOTE — Discharge Summary (Signed)
Brittany Hartman, SUESS NO.:  [id number]  MEDICAL RECORD NO.:  [id number]  LOCATION:  4W05C                        FACILITY:  [facility]  PHYSICIAN:  Maryla Morrow, MD        DATE OF BIRTH:  03-28-1966  DATE OF ADMISSION:  10/29/2016 DATE OF DISCHARGE:  11/09/2016                              DISCHARGE SUMMARY   DISCHARGE DIAGNOSES: 1. Multiple sclerosis, newly diagnosed with exacerbation. 2. Subcutaneous Lovenox for deep venous thrombosis prophylaxis. 3. Pain management. 4. Neurogenic bowel and bladder. 5. Hypertension. 6. Chronic kidney disease. 7. Steroid-induced hyperglycemia. 8. Morbid obesity. 9. Acute blood loss anemia.  HISTORY OF PRESENT ILLNESS:  This is a 51 year old right-handed female, history of hypertension, presented October 24, 2016, with progressive gait dysfunction over the past 6 years and recent increase in falls.  She lives with her cousin.  Her cousin works during the day.  MRI of the spine, CT-spine lesions per chart.  CT-MRI showed severe chronic supra and infratentorial demyelination with superimposed hyperacute acute demyelinating lesions including left superior cerebellar peduncle. Neurology consulted, placed on intravenous Solu-Medrol with taper.  MRI of cervical thoracic spine showed extensive patchy signal abnormalities throughout the cervical thoracic spine consistent with demyelinating disease.  There was some additional small disk protrusions at C2-3, C3- 4, C4-5, and C6-7 without significant stenosis.  Subcutaneous Lovenox for DVT prophylaxis.  The patient was admitted for comprehensive rehab program.  PAST MEDICAL HISTORY:  See discharge diagnoses.  SOCIAL HISTORY:  Lives with a cousin, reported to be independent with increasing falls.  FUNCTIONAL STATUS UPON ADMISSION TO REHAB SERVICES:  Moderate assist, 60- feet rolling walker; moderate assist, sit to stand; mod max assist, activities of daily living.  PHYSICAL EXAMINATION:   VITAL SIGNS:  Blood pressure 160/83, pulse 69, temperature 98, and respirations 18. GENERAL:  This was an alert female, in no acute distress. HEENT:  EOMs intact. NECK:  Supple.  Nontender.  No JVD. CARDIAC:  Regular without murmur. ABDOMEN:  Soft, nontender.  Good bowel sounds. LUNGS:  Clear to auscultation without wheeze. NEUROLOGIC:  Motor strength 5/5 throughout.  REHABILITATION HOSPITAL COURSE:  The patient was admitted to inpatient rehab services with therapies initiated on a 3-hour daily basis, consisting of physical therapy, occupational therapy, speech therapy, and rehabilitation nursing.  The following issues were addressed during the patient's rehabilitation stay.  Pertaining to Ms. Aveni's newly diagnosed multiple sclerosis, she had completed her intravenous Solu- Medrol taper.  Currently on low-dose prednisone 10 mg daily, which should be again taper as advised.  As per Neurology hospitalist team, recommendations for outpatient Neurology followup with appointment made with Dr. Despina Arias.  The patient was being discharged to home with her brother in [place].  Her request was to see Neurology in [place] and perhaps after established care, be transitioned to a neurologist closer to home in [place].  She remained on subcutaneous Lovenox for DVT prophylaxis.  Venous Doppler studies negative.  Pain management with the use of baclofen 10 mg b.i.d.  Neurogenic bowel and bladder.  PVRs had greatly improved.  She was on low-dose Ditropan b.i.d.  Blood pressures monitored on Norvasc and hydralazine.  No orthostasis.  Mild creatinine 1.09,  remained stable.  She did have some steroid-induced hyperglycemia, which improved with taper of steroids.  Body mass index 43.77.  Diet and exercise education provided in regard to her overall nutrition and health.  The patient received weekly collaborative interdisciplinary team conferences to discuss estimated length of stay, family  teaching, any barriers to discharge.  She was ambulating 150 feet rolling walker with close supervision, demonstrating improved control and self-monitoring with the patient stopping to rest and regain balance as needed.  Berg balance testing 29/56 improved from prior study of 21/56.  She could gather her belongings for activities of daily living and homemaking.  Occasional loss of balance posteriorly.  Full family teaching was completed.  Plan was to be discharged to home with her family in [place].  DISCHARGE MEDICATIONS: 1. Norvasc 10 mg p.o. daily. 2. Baclofen 10 mg p.o. b.i.d. 3. Hydralazine 75 mg p.o. t.i.d. 4. Ditropan 5 mg p.o. t.i.d. 5. Protonix 40 mg p.o. daily. 6. Prednisone 5 mg p.o. daily taper as advised. 7. Senokot-S 1 tablet p.o. b.i.d.  DIET:  Her diet was regular.  She would follow up with Dr. Maryla Morrow at the outpatient rehab service office as directed.  Dr. Despina Arias outpatient Neurology to establish appropriate care for multiple sclerosis.     Mariam Dollar, P.A.   ______________________________ Maryla Morrow, MD    DA/MEDQ  D:  11/08/2016  T:  11/08/2016  Job:  [id number]  cc:   Despina Arias, MD Maryla Morrow, MD

## 2016-11-08 NOTE — Progress Notes (Signed)
Social Work Patient ID: Brittany Hartman, female   DOB: 08/07/1965, 51 y.o.   MRN: [id number]  Met with pt and spoke with her brother on the phone while in pt's room. He wanted to make sure team is aware she is going to Mom's friends home and she is elderly and can only provide 24 hr supervision level. Discussed this is the level pt is currently and what she will require. He is planning to come to attend therapies with pt prior to discharge MD is working on getting pt a MD to follow up with. [facility] [place] to see if provides charity care for follow up ththerapies for pt. Will work on [employer] and obtaining her records to take with her at discharge.

## 2016-11-08 NOTE — Discharge Summary (Signed)
Discharge summary job # [id number]

## 2016-11-08 NOTE — Progress Notes (Signed)
Occupational Therapy Session Note  Patient Details  Name: Brittany Hartman MRN: [id number] Date of Birth: 1965-11-14  Today's Date: 11/08/2016 OT Individual Time: 5176-1607 OT Individual Time Calculation (min): 53 min    Short Term Goals: Week 2:  OT Short Term Goal 1 (Week 2): STGs=LTG d/t ELOS  Skilled Therapeutic Interventions/Progress Updates:    Pt completed bathing and dressing during session.  Pt's cousin present for session and was able to be educated on how pt is performing these tasks.  Supervision for transfers to the toilet and the walk-in shower.  Pt still needing mod instructional cueing for sequencing sit to stand at times and for pushing up from the surface she is sitting on.  Occasional min assist for sit to stand from the flat tub bench as well as min assist when attempting to stand unsupported and pull pants over hips or dry her back off.  LOB noted posteriorly with this with pt being educated to hold onto the walker with one hand and pull up garments with the other, as to help maintain balance.  Educated her cousin on the need for supervision with all bathing, dressing, and transfers at this time secondary to her fall risk as well as decreased carryover from session to session.  Also discussed the need for large pants as pt continues to need assistance pulling these over her hips.  Pt donned shoes from wheelchair position but needed increased time.  Discussed the use of AE for donning shoes and socks as well but feel with increased time pt can complete these tasks without equipment and minimal physical exertion.  Pt left in wheelchair at end of session with call button and phone in reach.    Therapy Documentation Precautions:  Precautions Precautions: Fall Restrictions Weight Bearing Restrictions: No   Pain: Pain Assessment Pain Assessment: No/denies pain ADL: See Function Navigator for Current Functional Status.   Therapy/Group: Individual Therapy  Shaunita Seney  OTR/L 11/08/2016, 1:28 PM

## 2016-11-08 NOTE — Progress Notes (Signed)
Physical Therapy Session Note  Patient Details  Name: Brittany Hartman MRN: [id number] Date of Birth: 1966/05/04  Today's Date: 11/08/2016 PT Individual Time: 3235-5732 and 2025-4270 PT Individual Time Calculation (min): 45 min  and 34 min  Skilled Therapeutic Interventions/Progress Updates:   Treatment 1: Patient in wheelchair upon arrival. Session focused on gait training multiple trials throughout rehab unit up to 200 ft at a time in home and controlled environments and across uneven woodchip surface using RW, functional transfers including low compliant furniture and simulate car transfer to sedan height using RW, bed mobility in regular bed to simulate home environment with mod I, up/down 12 (6") stairs using 2 rails, and retrieving object from floor x 2 with supervision except for bed mobility. Initiated OTAGO level A HEP for strengthening, balance, and falls reduction for LAQ and standing knee flexion using RW x 10 each exercise per LE but did not finish exercises due to time constraints, plan to complete reviewing exercises this afternoon. Patient ambulated back toward room about 75 ft before fatiguing and requiring seated rest break. Patient required intermittent rest breaks due to fatigue throughout session. Patient returned to room in wheelchair and left sitting with all needs within reach.   Treatment 2:  Patient in wheelchair, cousin present to observe therapy. Patient initiated walking toward gym but reported need to void, returned to room and toileted with distant supervision. Completed Level A OTAGO HEP using RW for BUE support with handout provided: standing hip abduction x 10 each LE, standing mini squats x 10, tandem stance leading with R then L LE to tolerance. Patient and cousin educated on exercise progression. Patient ambulated back to room using RW with cousin providing supervision. Adjusted patient's personal RW and wheelchair that had been delivered to room and demonstrated  wheelchair parts management. Patient and cousin with no further questions at this time with patient left sitting in wheelchair with needs in reach and cousin in room.   Therapy Documentation Precautions:  Precautions Precautions: Fall Restrictions Weight Bearing Restrictions: No Pain: Pain Assessment Pain Assessment: No/denies pain  See Function Navigator for Current Functional Status.  Therapy/Group: Individual Therapy  Rashmi Tallent, Prudencio Pair 11/08/2016, 9:17 AM

## 2016-11-08 NOTE — Progress Notes (Signed)
[facility] PHYSICAL MEDICINE & REHABILITATION     PROGRESS NOTE  Subjective/Complaints:  Pt seen ambulating with walker this AM.  She slept well overnight.  She states her brother has some more questions for me.    ROS: Denies CP, SOB, N/V/D.  Objective: Vital Signs: Blood pressure 121/75, pulse 73, temperature 97.6 F (36.4 C), temperature source Oral, resp. rate 18, weight 123 kg (271 lb 2.7 oz), SpO2 96 %. No results found.  Recent Labs  11/06/16 0007  WBC 10.8*  HGB 11.5*  HCT 35.8*  PLT 212    Recent Labs  11/06/16 0007  NA 139  K 3.7  CL 109  GLUCOSE 157*  BUN 19  CREATININE 1.09*  CALCIUM 8.4*   CBG (last 3)  No results for input(s): GLUCAP in the last 72 hours.  Wt Readings from Last 3 Encounters:  11/07/16 123 kg (271 lb 2.7 oz)  10/25/16 120.8 kg (266 lb 4.8 oz)  10/18/16 102.1 kg (225 lb)    Physical Exam:  BP 121/75 (BP Location: Left Arm)   Pulse 73   Temp 97.6 F (36.4 C) (Oral)   Resp 18   Wt 123 kg (271 lb 2.7 oz)   SpO2 96%   BMI 43.77 kg/m  Constitutional: She appears well-developed. Obese  HENT: Normocephalic and atraumatic.  Eyes: EOMI. No discharge.  Cardiovascular: RRR.  No JVD. Respiratory: Effort normal and breath sounds normal.  GI: Soft. Bowel sounds are normal.  Musculoskeletal: She exhibits edema. She exhibits no tenderness.  Neurological: She is alert and oriented.  Motor: 4+-5/5 throughout (unchanged, right stronger than left) Skin: Skin is warm and dry.  Psychiatric: She has a normal mood and affect. Her behavior is normal.   Assessment/Plan: 1. Functional deficits secondary to multiple sclerosis newly diagnosed with exacerbation which require 3+ hours per day of interdisciplinary therapy in a comprehensive inpatient rehab setting. Physiatrist is providing close team supervision and 24 hour management of active medical problems listed below. Physiatrist and rehab team continue to assess barriers to discharge/monitor  patient progress toward functional and medical goals.  Function:  Bathing Bathing position   Position: Shower  Bathing parts Body parts bathed by patient: Right arm, Left arm, Chest, Abdomen, Front perineal area, Buttocks, Right upper leg, Left upper leg, Right lower leg, Left lower leg Body parts bathed by helper: Back  Bathing assist Assist Level: Supervision or verbal cues      Upper Body Dressing/Undressing Upper body dressing   What is the patient wearing?: Bra, Pull over shirt/dress Bra - Perfomed by patient: Thread/unthread right bra strap, Thread/unthread left bra strap, Hook/unhook bra (pull down sports bra)   Pull over shirt/dress - Perfomed by patient: Thread/unthread right sleeve, Thread/unthread left sleeve, Put head through opening, Pull shirt over trunk          Upper body assist Assist Level: Supervision or verbal cues   Set up : To obtain clothing/put away  Lower Body Dressing/Undressing Lower body dressing   What is the patient wearing?: Pants, Shoes, Ted Hose, Non-skid slipper socks     Pants- Performed by patient: Thread/unthread right pants leg, Thread/unthread left pants leg, Pull pants up/down Pants- Performed by helper: Pull pants up/down Non-skid slipper socks- Performed by patient: Don/doff right sock, Don/doff left sock Non-skid slipper socks- Performed by helper: Don/doff right sock, Don/doff left sock Socks - Performed by patient: Don/doff right sock, Don/doff left sock (sock aide)   Shoes - Performed by patient: Don/doff right shoe,  Don/doff left shoe, Fasten right, Fasten left Shoes - Performed by helper: Don/doff right shoe, Don/doff left shoe, Fasten right, Fasten left       TED Hose - Performed by helper: Don/doff right TED hose, Don/doff left TED hose  Lower body assist Assist for lower body dressing: Touching or steadying assistance (Pt > 75%)      Toileting Toileting Toileting activity did not occur: No continent bowel/bladder  event Toileting steps completed by patient: Adjust clothing prior to toileting, Performs perineal hygiene, Adjust clothing after toileting Toileting steps completed by helper: Adjust clothing after toileting Toileting Assistive Devices: Grab bar or rail  Toileting assist Assist level: Supervision or verbal cues   Transfers Chair/bed transfer   Chair/bed transfer method: Ambulatory Chair/bed transfer assist level: Supervision or verbal cues Chair/bed transfer assistive device: Armrests, Patent attorney     Max distance: 200 ft Assist level: Supervision or verbal cues   Wheelchair   Type: Manual Max wheelchair distance: 150 ft Assist Level: Supervision or verbal cues  Cognition Comprehension Comprehension assist level: Follows complex conversation/direction with extra time/assistive device  Expression Expression assist level: Expresses complex ideas: With extra time/assistive device  Social Interaction Social Interaction assist level: Interacts appropriately with others with medication or extra time (anti-anxiety, antidepressant).  Problem Solving Problem solving assist level: Solves basic 75 - 89% of the time/requires cueing 10 - 24% of the time  Memory Memory assist level: Recognizes or recalls 75 - 89% of the time/requires cueing 10 - 24% of the time    Medical Problem List and Plan: 1.  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   Continue prednisone daily and taper, decreased again on 4/25  Cont CIR   Now, will have patient follow up in [place] for initial visit at the least 2.  DVT Prophylaxis/Anticoagulation: Subcutaneous Lovenox. Monitor for any bleeding episodes.   Vascular study neg 4/17 3. Pain Management: Baclofen 10 mg twice a day 4. Mood: Provide emotional support 5. Neuropsych: This patient is capable of making decisions on her own behalf. 6. Skin/Wound Care: Routine skin checks 7.  Fluids/Electrolytes/Nutrition: Routine I&Os 8. Neurogenic bowel and bladder.   PVR 3, acceptable  Bowel reg increased 4/18  Ditropan started 4/25 9. Hypertension. Norvasc 10 mg daily  Hydralazine increased to 75mg  on 4/18   Controlled 4/26 10. CKD:   Cr 1.09 on 4/24  Cont to monitor 11. Steroid induced hyperglycemia: SSI  Overall controlled 4/26 12. Morbid obesity  Body mass index is 43.77 kg/m.  Diet and exercise education  Encourage weight loss to increase endurance and promote overall health 13. Hypoalbuminemia  Supplement initiated 4/17 14. Leukocytosis  WBCs 10.8 on 4/24  Cont to monitor  UA?, Ucx pending 15. ABLA  Hb 11.5 on 4/24  Cont to monitor  >35 minutes spent with patient and brother with >30 minutes in educating, counseling, and coordinating care regarding meds, discharge plans, follow up appointments  LOS (Days) 10 A FACE TO FACE EVALUATION WAS PERFORMED  Ankit Karis Juba 11/08/2016 8:37 AM

## 2016-11-08 NOTE — Patient Care Conference (Signed)
Inpatient RehabilitationTeam Conference and Plan of Care Update Date: 11/07/2016   Time: 2:30 PM    Patient Name: Brittany Hartman      Medical Record Number: [id number]  Date of Birth: 03-13-1966 Sex: Female         Room/Bed: 4W05C/4W05C-01 Payor Info: Payor: MEDICAID POTENTIAL / Plan: MEDICAID POTENTIAL / Product Type: *No Product type* /    Admitting Diagnosis: MS  Admit Date/Time:  10/29/2016  9:18 PM Admission Comments: No comment available   Primary Diagnosis:  <principal problem not specified> Principal Problem: <principal problem not specified>  Patient Active Problem List   Diagnosis Date Noted  . Acute blood loss anemia   . Leukocytosis   . Multiple sclerosis exacerbation (HCC) 10/30/2016  . Stage 2 chronic kidney disease   . Morbid obesity (HCC)   . Hypoalbuminemia due to protein-calorie malnutrition (HCC)   . MS (multiple sclerosis) (HCC)   . Neurologic gait disorder   . Muscle spasm   . Neurogenic bowel   . Neurogenic bladder   . Benign essential HTN   . Steroid-induced hyperglycemia   . Stage 3 chronic kidney disease   . Multiple sclerosis (HCC) 10/24/2016  . Essential hypertension 10/24/2016  . Acute cystitis with hematuria 10/24/2016    Expected Discharge Date: Expected Discharge Date: 11/09/16  Team Members Present: Physician leading conference: Dr. Maryla Morrow Social Worker Present: Dossie Der, LCSW Nurse Present: Carmie End, RN PT Present: Bayard Hugger, PT;Other (comment) (Brad Zanino-PT) OT Present: Perrin Maltese, OT SLP Present: Jackalyn Lombard, SLP PPS Coordinator present : Tora Duck, RN, CRRN     Current Status/Progress Goal Weekly Team Focus  Medical   Gait disorder secondary to multiple sclerosis newly diagnosed with exacerbation  Improve mobility, HTN, neurogenic bladder, leukocytosis  See above   Bowel/Bladder   Incontinent of bowel and bladder ( can be continent at times), LBM 11-04-16  Continent of bowel and bladder, maintain regular  bowel pattern  Assist with toileting needs prn, Assess bowel pattern q shift   Swallow/Nutrition/ Hydration             ADL's   Pt performs UB self care with supervision. pt requires superivision-MIN A for LB self care and transfers to toilet/TTB.   supervision overall  self care retraining, balance, transfers, safety awareness, therapeutic exercise, pt/family ed   Mobility   supervision overall using RW, min-mod A without RW  mod I-supervision  functional mobility, NMR, standing balance, activity tolerance, pt/family education, DC planning   Communication             Safety/Cognition/ Behavioral Observations  min assist for safety awareness and higher level problem solving   supervision   completion of family education and addresing goals for semi-complex problem solving, memory, and awareness    Pain   no c/o pain   <3  Assess pain q shift and prn   Skin   no skin issues  no skin issues  Assess skin q shift and prn      *See Care Plan and progress notes for long and short-term goals.  Barriers to Discharge: Mobiltiy, safety, HTN, neurogenic bladder, steroid induced hyperglycemia, leukocytosis    Possible Resolutions to Barriers:  Therapies, adjust bladder meds, UA/Ucx ordered    Discharge Planning/Teaching Needs:  Going to brother's home in [place], she will have supervision there. He is coming Friday am to attend therapies with pt and transport her home      Team Discussion:  Progressing toward her  supervision level goals. Tapering steroids and will get maintance Hartman.urinary frequency-started on meds to help with this. Has some problem solving  Issues. Poor safety awareness. UA ordered to check prior to DC for UTI. Pt needs to find Neuro-MD for follow up where she stays in [place]  Revisions to Treatment Plan:  DC 4/27   Continued Need for Acute Rehabilitation Level of Care: The patient requires daily medical management by a physician with specialized training in physical  medicine and rehabilitation for the following conditions: Daily direction of a multidisciplinary physical rehabilitation program to ensure safe treatment while eliciting the highest outcome that is of practical value to the patient.: Yes Daily medical management of patient stability for increased activity during participation in an intensive rehabilitation regime.: Yes Daily analysis of laboratory values and/or radiology reports with any subsequent need for medication adjustment of medical intervention for : Neurological problems;Blood pressure problems;Other;Urological problems  [name], Lemar Livings 11/08/2016, 11:04 AM

## 2016-11-08 NOTE — Progress Notes (Signed)
Speech Language Pathology Daily Session Note  Patient Details  Name: Brittany Hartman MRN: [id number] Date of Birth: 13-Aug-1965  Today's Date: 11/08/2016 SLP Individual Time: 4098-1191 SLP Individual Time Calculation (min): 55 min  Short Term Goals: Week 1: SLP Short Term Goal 1 (Week 1): Pt will complete semi-complex medication management tasks with supervision cues.  SLP Short Term Goal 2 (Week 1): Pt will complete semi-complex money management tasks with supervision cues.  SLP Short Term Goal 3 (Week 1): Pt will utilize external memory aids to organize and recall new daily information with supervision cues.  SLP Short Term Goal 4 (Week 1): Pt will state 3 activities that are safe for her to perform at next venue of care with supervision cues.  SLP Short Term Goal 5 (Week 1): Pt will perform semi-complex time management/organization tasks such as scheduling or calendar tasks with supervision cues.   Skilled Therapeutic Interventions: Pt was seen for skilled ST targeting cognitive goals.  SLP re-administered the MoCA to measure progress from initial evaluation.  Pt scored 27/30 on assessment with n>/= 26.  SLP reviewed and reinforced cognitive compensatory strategies for memory, organization, and safety awareness; including making lists,  Using phone apps and alarms, routines, and environmental modifications.  SLP also discussed activities for cognitive remediation and compensation to maximize treatment effects after discharge.  SLP also facilitated the session with a novel deductive reasoning puzzle to address goals for problem solving.  Pt was able to completed task with overall min assist-supervision verbal cues to recognize and correct errors.  Pt was left in chair with call bell within reach and friend at bedside.  Continue per current plan of care.       Function:  Eating Eating                 Cognition Comprehension Comprehension assist level: Follows complex  conversation/direction with extra time/assistive device  Expression   Expression assist level: Expresses complex ideas: With extra time/assistive device  Social Interaction Social Interaction assist level: Interacts appropriately with others with medication or extra time (anti-anxiety, antidepressant).  Problem Solving Problem solving assist level: Solves basic 90% of the time/requires cueing < 10% of the time  Memory Memory assist level: Recognizes or recalls 75 - 89% of the time/requires cueing 10 - 24% of the time    Pain Pain Assessment Pain Assessment: No/denies pain  Therapy/Group: Individual Therapy  Hussam Muniz, Melanee Spry 11/08/2016, 10:46 AM

## 2016-11-08 NOTE — Progress Notes (Signed)
Physical Therapy Discharge Summary  Patient Details  Name: Brittany Hartman MRN: [id number] Date of Birth: 1965/11/05  Today's Date: 11/09/2016 PT Individual Time: 1132-1200 PT Individual Time Calculation (min): 28 min    Patient has met 8 of 8 long term goals due to improved activity tolerance, improved balance, improved postural control, increased strength, ability to compensate for deficits, improved attention, improved awareness and improved coordination.  Patient to discharge at an ambulatory level Supervision.   Patient's care partner is independent to provide the necessary physical and cognitive assistance at discharge.  Reasons goals not met: NA  Recommendation:  Patient will benefit from ongoing skilled PT services in outpatient setting to continue to advance safe functional mobility, address ongoing impairments in strength, coordination, standing balance, balance strategies, safety awareness, activity tolerance, energy conservation, and minimize fall risk.  Equipment: heavy duty RW, manual wheelchair  Reasons for discharge: treatment goals met and discharge from hospital  Patient/family agrees with progress made and goals achieved: Yes  PT Discharge Precautions/Restrictions Precautions Precautions: Fall Restrictions Weight Bearing Restrictions: No Vital Signs Therapy Vitals Temp: 97.6 F (36.4 C) Temp Source: Oral Pulse Rate: 73 Resp: 18 BP: 121/75 Patient Position (if appropriate): Lying Oxygen Therapy SpO2: 96 % O2 Device: Not Delivered Pain Pain Assessment Pain Assessment: No/denies pain   Sensation Sensation Light Touch: Appears Intact Stereognosis: Appears Intact Hot/Cold: Appears Intact Proprioception: Appears Intact Coordination Gross Motor Movements are Fluid and Coordinated: No Fine Motor Movements are Fluid and Coordinated: No Motor  Motor Motor: Abnormal postural alignment and control;Ataxia  Mobility Bed Mobility Bed Mobility: Rolling  Right;Rolling Left;Supine to Sit;Sit to Supine Rolling Right: 6: Modified independent (Device/Increase time) Rolling Left: 6: Modified independent (Device/Increase time) Supine to Sit: 6: Modified independent (Device/Increase time) Sit to Supine: 6: Modified independent (Device/Increase time) Transfers Transfers: Yes Sit to Stand: 5: Supervision Stand to Sit: 5: Supervision Locomotion  Ambulation Ambulation: Yes Ambulation/Gait Assistance: 5: Supervision Ambulation Distance (Feet): 200 Feet Assistive device: Rolling walker Gait Gait: Yes Gait Pattern: Impaired Gait Pattern: Step-through pattern;Decreased stride length;Poor foot clearance - right;Poor foot clearance - left;Trunk flexed;Ataxic;Decreased hip/knee flexion - left;Decreased hip/knee flexion - right Gait velocity: 10 MWT = 0.59 m/s Stairs / Additional Locomotion Stairs: Yes Stairs Assistance: 5: Supervision Stair Management Technique: Alternating pattern;Step to pattern;Forwards Number of Stairs: 12 Height of Stairs: 6 Ramp: 5: Supervision Wheelchair Mobility Wheelchair Mobility: No  Trunk/Postural Assessment  Cervical Assessment Cervical Assessment: Within Functional Limits Thoracic Assessment Thoracic Assessment: Within Functional Limits Lumbar Assessment Lumbar Assessment: Exceptions to [facility] (posterior pelvic tilt) Postural Control Postural Control: Deficits on evaluation Protective Responses: impaired  Balance Balance Balance Assessed: Yes Standardized Balance Assessment Standardized Balance Assessment: Berg Balance Test Berg Balance Test Sit to Stand: Able to stand without using hands and stabilize independently Standing Unsupported: Able to stand 2 minutes with supervision Sitting with Back Unsupported but Feet Supported on Floor or Stool: Able to sit safely and securely 2 minutes Stand to Sit: Sits safely with minimal use of hands Transfers: Able to transfer with verbal cueing and /or  supervision Standing Unsupported with Eyes Closed: Able to stand 10 seconds with supervision Standing Ubsupported with Feet Together: Needs help to attain position but able to stand for 30 seconds with feet together From Standing, Reach Forward with Outstretched Arm: Reaches forward but needs supervision From Standing Position, Pick up Object from Floor: Able to pick up shoe, needs supervision From Standing Position, Turn to Look Behind Over each Shoulder: Needs supervision when turning Turn 360  Degrees: Needs close supervision or verbal cueing Standing Unsupported, Alternately Place Feet on Step/Stool: Needs assistance to keep from falling or unable to try Standing Unsupported, One Foot in Front: Needs help to step but can hold 15 seconds Standing on One Leg: Tries to lift leg/unable to hold 3 seconds but remains standing independently Total Score: 29 Static Standing Balance Static Standing - Balance Support: During functional activity;Bilateral upper extremity supported Static Standing - Level of Assistance: 6: Modified independent (Device/Increase time) Dynamic Standing Balance Dynamic Standing - Balance Support: Left upper extremity supported;During functional activity;Right upper extremity supported Dynamic Standing - Level of Assistance: 5: Stand by assistance Extremity Assessment      RLE Assessment RLE Assessment: Within Functional Limits LLE Assessment LLE Assessment: Within Functional Limits   See Function Navigator for Current Functional Status.  Ward, Wells Guiles A 11/08/2016, 9:06 AM

## 2016-11-09 ENCOUNTER — Inpatient Hospital Stay (HOSPITAL_COMMUNITY): Payer: Self-pay | Admitting: Physical Therapy

## 2016-11-09 ENCOUNTER — Encounter (HOSPITAL_COMMUNITY): Payer: Self-pay | Admitting: Occupational Therapy

## 2016-11-09 ENCOUNTER — Encounter (HOSPITAL_COMMUNITY): Payer: Self-pay | Admitting: Speech Pathology

## 2016-11-09 MED ORDER — HYDRALAZINE HCL 25 MG PO TABS: 75.0000 mg | ORAL_TABLET | Freq: Three times a day (TID) | ORAL | 1 refills | Status: AC

## 2016-11-09 MED ORDER — PREDNISONE 5 MG PO TABS: 5.0000 mg | ORAL_TABLET | Freq: Every day | ORAL | 0 refills | Status: AC

## 2016-11-09 MED ORDER — BACLOFEN 10 MG PO TABS: 10.0000 mg | ORAL_TABLET | Freq: Three times a day (TID) | ORAL | 0 refills | Status: AC

## 2016-11-09 MED ORDER — PREDNISONE 5 MG PO TABS: 5.0000 mg | ORAL_TABLET | Freq: Every evening | ORAL | Status: DC

## 2016-11-09 MED ORDER — PANTOPRAZOLE SODIUM 40 MG PO TBEC: 40.0000 mg | DELAYED_RELEASE_TABLET | Freq: Every day | ORAL | 2 refills | Status: AC

## 2016-11-09 MED ORDER — AMLODIPINE BESYLATE 10 MG PO TABS: 10.0000 mg | ORAL_TABLET | Freq: Every day | ORAL | 2 refills | Status: AC

## 2016-11-09 MED ORDER — OXYBUTYNIN CHLORIDE 5 MG PO TABS: 5.0000 mg | ORAL_TABLET | Freq: Three times a day (TID) | ORAL | 1 refills | Status: AC

## 2016-11-09 MED ORDER — PREDNISONE 5 MG PO TABS: 5.0000 mg | ORAL_TABLET | Freq: Every evening | ORAL | 0 refills | Status: AC

## 2016-11-09 NOTE — Progress Notes (Signed)
Speech Language Pathology Discharge Summary  Patient Details  Name: Brittany Hartman MRN: [id number] Date of Birth: 18-Mar-1966  Today's Date: 11/09/2016 SLP Individual Time: 1008-1030 SLP Individual Time Calculation (min): 22 min   Skilled Therapeutic Interventions:  Pt was seen for skilled ST targeting family education prior to discharge home.  Pt's brother and sister in law were present and remained actively engaged in training.  SLP discussed pt's current level of function and progress towards goals in therapy.  SLP also provided skilled education regarding cognitive compensatory techniques.  Pt was encouraged to remain cognitively active once discharged and therapist provided ideas for activities and tasks to maximize effects of cognitive therapy.  A handout was provided of therapy recommendations.  All questions were answered to pt's and family's satisfaction at this time.  Pt is ready for discharge this afternoon.      Patient has met 3 of 3 long term goals.  Patient to discharge at overall Supervision level.  Reasons goals not met: n/a   Clinical Impression/Discharge Summary:   Pt has made functional gains while inpatient and is discharging having met 3 out of 3 long term goals.  Pt is currently at a supervision level of assistance for semi-complex tasks due to mild, higher level cognitive impairments.  Pt would benefit from additional ST follow up at next level of care in addition to assistance for complex home management such as medications and finances.  Pt and family education is complete.  Pt is discharging with 24/7 supervision from a family friend who will be trained by pt's brother.     Care Partner:  Caregiver Able to Provide Assistance: Yes  Type of Caregiver Assistance: Physical;Cognitive  Recommendation:  Home Health SLP;24 hour supervision/assistance;Outpatient SLP  Rationale for SLP Follow Up: Reduce caregiver burden;Maximize cognitive function and independence    Equipment: none recommended by SLP    Reasons for discharge: Discharged from hospital   Patient/Family Agrees with Progress Made and Goals Achieved: Yes   Function:  Eating Eating   Modified Consistency Diet: No Eating Assist Level: More than reasonable amount of time           Cognition Comprehension Comprehension assist level: Follows complex conversation/direction with extra time/assistive device  Expression   Expression assist level: Expresses complex ideas: With extra time/assistive device  Social Interaction Social Interaction assist level: Interacts appropriately with others with medication or extra time (anti-anxiety, antidepressant).  Problem Solving Problem solving assist level: Solves basic 90% of the time/requires cueing < 10% of the time  Memory Memory assist level: Recognizes or recalls 90% of the time/requires cueing < 10% of the time   Emilio Math 11/09/2016, 12:47 PM

## 2016-11-09 NOTE — Discharge Instructions (Signed)
Inpatient Rehab Discharge Instructions  Brittany Hartman Discharge date and time: No discharge date for patient encounter.   Activities/Precautions/ Functional Status: Activity: activity as tolerated Diet: regular diet Wound Care: none needed Functional status:  ___ No restrictions     ___ Walk up steps independently ___ 24/7 supervision/assistance   ___ Walk up steps with assistance ___ Intermittent supervision/assistance  ___ Bathe/dress independently ___ Walk with walker     ___ Bathe/dress with assistance ___ Walk Independently    ___ Shower independently ___ Walk with assistance    _x__ Shower with assistance ___ No alcohol     ___ Return to work/school ________  Special Instructions: Follow-up with [facility] neurological Dr. Theadore Nan in regards to multiple sclerosis and plan of care [phone]  COMMUNITY REFERRALS UPON DISCHARGE:   None:  TRYING TO FIND HOME HEALTH OR OUTPATIENT TO TAKE CHARITY CARE, HOME EXERCISE PROGRAM GIVEN TO PATIENT TO WORK ON  Medical Equipment/Items Ordered:WHEELCHAIR, WIDE BEDSIDE COMMODE, WIDE ROLLING WALKER & TUB BENCH  Agency/Supplier:[facility] [employer] [phone]  Other:APPLIED FOR MEDICAID AND DISABILITY BOTH PENDING-PATIENT TO FOLLOW UP WITH MATCH PROGRAM GIVEN TO PATIENT AND BROTHER FOR MEDICATION ASSISTANCE   My questions have been answered and I understand these instructions. I will adhere to these goals and the provided educational materials after my discharge from the hospital.  Patient/Caregiver Signature _______________________________ Date __________  Clinician Signature _______________________________________ Date __________  Please bring this form and your medication list with you to all your follow-up doctor's appointments.

## 2016-11-09 NOTE — Progress Notes (Signed)
Social Work  Discharge Note  The overall goal for the admission was met for:   Discharge location: Yes-GOING TO [place] LEVEL  Length of Stay: Yes-11 DAYS  Discharge activity level: Yes-SUPERVISION LEVEL  Home/community participation: Yes  Services provided included: MD, RD, PT, OT, SLP, RN, CM, TR, Pharmacy, Neuropsych and SW  Financial Services: Other: PENDING MEDICAID  Follow-up services arranged: DME: [facility]-WHEELCHAIR,WIDE ROLLING WALKER, WIDE BEDSIDE COMMODE AND TUB BENCH and Other: TRYING TO FOND HH OR OP THAT WILL DO CHARITY CARE FOR THERAPIES WITH PT, MAY NEED TO WAIT UNTIL MEDICAID APPORVED.  Comments (or additional information):BROTHER CAME IN FOR FAMILY EDUCATION AND WILL TRANSPORT TO [facility] IN [place]. HAS APPLIED FOR DISABILITY AND MEDICAID, BOTH ARE PENDING MATCH GIVEN TO PT AND BROTHER TO GET FILLED PRIOR TO LEAVING [place]. HOME EXERCISE PROGRAM GIVEN TO PT. MEDICAL CLINIC INFORMATION ALSO GIVEN PT TO FOLLOW UP WITH,.  Patient/Family verbalized understanding of follow-up arrangements: Yes  Individual responsible for coordination of the follow-up plan: SELF & BROTHER  Confirmed correct DME delivered: Elease Hashimoto 11/09/2016    Elease Hashimoto

## 2016-11-09 NOTE — Progress Notes (Signed)
[facility] PHYSICAL MEDICINE & REHABILITATION     PROGRESS NOTE  Subjective/Complaints:  Pt seen laying in bed this AM.  She slept well overnight.  She has questions about discharge, neurology appointment, meds, which were discussed yesterday.   ROS: Denies CP, SOB, N/V/D.  Objective: Vital Signs: Blood pressure (!) 147/88, pulse 65, temperature 98.2 F (36.8 C), temperature source Oral, resp. rate 18, weight 123 kg (271 lb 2.7 oz), SpO2 98 %. No results found. No results for input(s): WBC, HGB, HCT, PLT in the last 72 hours. No results for input(s): NA, K, CL, GLUCOSE, BUN, CREATININE, CALCIUM in the last 72 hours.  Invalid input(s): CO CBG (last 3)  No results for input(s): GLUCAP in the last 72 hours.  Wt Readings from Last 3 Encounters:  11/07/16 123 kg (271 lb 2.7 oz)  10/25/16 120.8 kg (266 lb 4.8 oz)  10/18/16 102.1 kg (225 lb)    Physical Exam:  BP (!) 147/88 (BP Location: Right Arm)   Pulse 65   Temp 98.2 F (36.8 C) (Oral)   Resp 18   Wt 123 kg (271 lb 2.7 oz)   SpO2 98%   BMI 43.77 kg/m  Constitutional: She appears well-developed. Obese  HENT: Normocephalic and atraumatic.  Eyes: EOMI. No discharge.  Cardiovascular: RRR.  No JVD. Respiratory: Effort normal and breath sounds normal.  GI: Soft. Bowel sounds are normal.  Musculoskeletal: She exhibits edema. She exhibits no tenderness.  Neurological: She is alert and oriented.  Motor: 4+-5/5 throughout (stable, right stronger than left) Skin: Skin is warm and dry.  Psychiatric: She has a normal mood and affect. Her behavior is normal.   Assessment/Plan: 1. Functional deficits secondary to multiple sclerosis newly diagnosed with exacerbation which require 3+ hours per day of interdisciplinary therapy in a comprehensive inpatient rehab setting. Physiatrist is providing close team supervision and 24 hour management of active medical problems listed below. Physiatrist and rehab team continue to assess barriers to  discharge/monitor patient progress toward functional and medical goals.  Function:  Bathing Bathing position   Position: Shower  Bathing parts Body parts bathed by patient: Right arm, Left arm, Chest, Abdomen, Front perineal area, Buttocks, Right upper leg, Left upper leg, Right lower leg, Left lower leg Body parts bathed by helper: Back  Bathing assist Assist Level: Supervision or verbal cues      Upper Body Dressing/Undressing Upper body dressing   What is the patient wearing?: Bra, Pull over shirt/dress Bra - Perfomed by patient: Thread/unthread right bra strap, Thread/unthread left bra strap, Hook/unhook bra (pull down sports bra)   Pull over shirt/dress - Perfomed by patient: Thread/unthread right sleeve, Thread/unthread left sleeve, Put head through opening, Pull shirt over trunk          Upper body assist Assist Level: Supervision or verbal cues   Set up : To obtain clothing/put away  Lower Body Dressing/Undressing Lower body dressing   What is the patient wearing?: Pants, Shoes, Ted Hose, Non-skid slipper socks     Pants- Performed by patient: Thread/unthread right pants leg, Thread/unthread left pants leg Pants- Performed by helper: Pull pants up/down Non-skid slipper socks- Performed by patient: Don/doff right sock, Don/doff left sock Non-skid slipper socks- Performed by helper: Don/doff right sock, Don/doff left sock Socks - Performed by patient: Don/doff right sock, Don/doff left sock (doff socks)   Shoes - Performed by patient: Don/doff right shoe, Don/doff left shoe, Fasten right, Fasten left Shoes - Performed by helper: Don/doff right shoe, Don/doff  left shoe, Fasten right, Fasten left       TED Hose - Performed by helper: Don/doff right TED hose, Don/doff left TED hose  Lower body assist Assist for lower body dressing: Supervision or verbal cues      Toileting Toileting Toileting activity did not occur: No continent bowel/bladder event Toileting steps  completed by patient: Adjust clothing prior to toileting, Performs perineal hygiene, Adjust clothing after toileting Toileting steps completed by helper: Adjust clothing after toileting Toileting Assistive Devices: Grab bar or rail  Toileting assist Assist level: Supervision or verbal cues   Transfers Chair/bed transfer   Chair/bed transfer method: Ambulatory Chair/bed transfer assist level: Supervision or verbal cues Chair/bed transfer assistive device: Armrests, Patent attorney     Max distance: 200 ft Assist level: Supervision or verbal cues   Wheelchair   Type: Manual Max wheelchair distance: 150 ft Assist Level: Supervision or verbal cues  Cognition Comprehension Comprehension assist level: Follows complex conversation/direction with extra time/assistive device  Expression Expression assist level: Expresses complex ideas: With extra time/assistive device  Social Interaction Social Interaction assist level: Interacts appropriately with others with medication or extra time (anti-anxiety, antidepressant).  Problem Solving Problem solving assist level: Solves basic 90% of the time/requires cueing < 10% of the time  Memory Memory assist level: Recognizes or recalls 75 - 89% of the time/requires cueing 10 - 24% of the time    Medical Problem List and Plan: 1.  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   Continue prednisone daily and taper, decreased again on 4/27  D/c today  Will see patient in 1-2 weeks for transitional care management  Now, will have patient follow up in [facility] for initial visit at the least 2.  DVT Prophylaxis/Anticoagulation: Subcutaneous Lovenox. Monitor for any bleeding episodes.   Vascular study neg 4/17 3. Pain Management: Baclofen 10 mg twice a day 4. Mood: Provide emotional support 5. Neuropsych: This patient is capable of making decisions on her own behalf. 6. Skin/Wound Care: Routine skin  checks 7. Fluids/Electrolytes/Nutrition: Routine I&Os 8. Neurogenic bowel and bladder.   PVR 3, acceptable  Bowel reg increased 4/18  Ditropan started 4/25 9. Hypertension. Norvasc 10 mg daily  Hydralazine increased to 75mg  on 4/18   Controlled 4/27 10. CKD:   Cr 1.09 on 4/24  Cont to monitor 11. Steroid induced hyperglycemia: SSI  Overall controlled 4/27 12. Morbid obesity  Body mass index is 43.77 kg/m.  Diet and exercise education  Encourage weight loss to increase endurance and promote overall health 13. Hypoalbuminemia  Supplement initiated 4/17 14. Leukocytosis  WBCs 10.8 on 4/24  Cont to monitor  UA?, Ucx with multiple species 15. ABLA  Hb 11.5 on 4/24  Cont to monitor  LOS (Days) 11 A FACE TO FACE EVALUATION WAS PERFORMED  Dayton Kenley Karis Juba 11/09/2016 8:40 AM

## 2016-11-09 NOTE — Progress Notes (Signed)
Physical Therapy Session Note  Patient Details  Name: Brittany Hartman MRN: [id number] Date of Birth: 06/17/1966  Today's Date: 11/09/2016 PT Individual Time: 1132-1200 PT Individual Time Calculation (min): 28 min   Short Term Goals: Week 2:     Skilled Therapeutic Interventions/Progress Updates:    Pt sitting in w/c upon arrival, agreeable to PT session. Patient's cousin present for family education. Reviewed transfers with emphasis on sequence and guarding. Pt performing from w/c<>chair. W/c parts management discussed with pt and family and both deny questions following. Ambulation: X 150 ft with rw and supervision. Reviewed posture, technique and guarding position with family. Discussed general safety considerations at home including footwear, supervision, activity progression. Following session, pt and family deny any questions or concerns. Pt up in w/c with family present and all needs in reach. Pt declined reviewing car transfers.   Therapy Documentation Precautions:  Precautions Precautions: Fall Restrictions Weight Bearing Restrictions: No   Pain: Pain Assessment Pain Assessment: No/denies pain   See Function Navigator for Current Functional Status.   Therapy/Group: Individual Therapy  Delton See, PT 11/09/2016, 12:57 PM

## 2016-11-09 NOTE — Progress Notes (Signed)
Occupational Therapy Discharge Summary  Patient Details  Name: Brittany Hartman MRN: [id number] Date of Birth: [birth year]  Today's Date: 11/09/2016 OT Individual Time: 3810-1751 OT Individual Time Calculation (min): 57 min    Session Note:  Pt completed bathing and dressing with family present for education.  Pt's brother and sister-in-law were present.  Educated both on the need for constant supervision with all transfers and mobility using the RW.  Provided demonstration for appropriate techniques utilized for toilet and shower transfers as well as for kitchen tasks.  Pt also completed bathing and dressing sit to stand shower level with sister-in-law observing as well as for toileting.  Pt continues to need mod instructional cueing for safety and for slowing down and processing through sit to stand in order to achieve correct hand placement and technique.  Increased time needed but pt was able to donn and tie her shoes from the wheelchair level.  Discussed the importance of being aware of how close she is to the edge of the chair or bed and not sliding off into the floor.    Patient has met 11 of 11 long term goals due to improved balance, functional use of  RIGHT upper and LEFT upper extremity, improved awareness and improved coordination.  Patient to discharge at overall Supervision level.  Patient's care partner is independent to provide the necessary physical and cognitive assistance at discharge.    Reasons goals not met: NA  Recommendation:  Patient will benefit from ongoing skilled OT services in home health setting to continue to advance functional skills in the area of BADL and Vocation. Pt continues to need supervision for safety as well as functional tasks secondary to generalized weakness in her LEs.  Recommend follow-up OT for continuation of progression toward modified independent level.    Equipment: tub bench, wide 3:1, RW  Reasons for discharge: treatment goals met and discharge  from hospital  Patient/family agrees with progress made and goals achieved: Yes  OT Discharge Precautions/Restrictions  Precautions Precautions: Fall Restrictions Weight Bearing Restrictions: No  Pain  No report of pain  ADL  See Function Section of chart for details  Vision/Perception  Vision- Assessment Eye Alignment: Within Functional Limits Perception Perception: Within Functional Limits Praxis Praxis: Intact  Cognition Overall Cognitive Status: Impaired/Different from baseline Arousal/Alertness: Awake/alert Orientation Level: Oriented X4 Attention: Selective Focused Attention: Appears intact Selective Attention: Appears intact Memory: Impaired Memory Impairment: Decreased recall of new information Awareness: Impaired Awareness Impairment: Anticipatory impairment Problem Solving: Impaired Problem Solving Impairment: Functional basic Comments: Pt continues to need mod instructional cueing to sequence hand placement for sit to stand.   Sensation Sensation Light Touch: Appears Intact Stereognosis: Appears Intact Hot/Cold: Appears Intact Proprioception: Appears Intact Coordination Gross Motor Movements are Fluid and Coordinated: Yes Fine Motor Movements are Fluid and Coordinated: Yes Coordination and Movement Description: UE coordination intact  Mobility  Transfers Transfers: Sit to Stand;Stand to Sit Sit to Stand: 5: Supervision;With upper extremity assist;From toilet Stand to Sit: 5: Supervision;With upper extremity assist;To toilet  Trunk/Postural Assessment  Cervical Assessment Cervical Assessment: Within Functional Limits Thoracic Assessment Thoracic Assessment: Within Functional Limits Lumbar Assessment Lumbar Assessment: Exceptions to [facility] (posterior pelvic tilt)  Balance Balance Balance Assessed: Yes Static Sitting Balance Static Sitting - Balance Support: Feet supported Static Sitting - Level of Assistance: 6: Modified independent  (Device/Increase time) Dynamic Sitting Balance Dynamic Sitting - Balance Support: Feet supported;During functional activity Dynamic Sitting - Level of Assistance: 6: Modified independent (Device/Increase time) Static Standing Balance Static  Standing - Balance Support: During functional activity Static Standing - Level of Assistance: 5: Stand by assistance Dynamic Standing Balance Dynamic Standing - Balance Support: During functional activity Dynamic Standing - Level of Assistance: 5: Stand by assistance Extremity/Trunk Assessment RUE Assessment RUE Assessment: Within Functional Limits LUE Assessment LUE Assessment: Within Functional Limits   See Function Navigator for Current Functional Status.  Joeangel Jeanpaul OTR/L 11/09/2016, 4:58 PM

## 2016-11-12 ENCOUNTER — Telehealth: Payer: Self-pay

## 2016-11-12 NOTE — Telephone Encounter (Signed)
Transitional Care call  Patient name: Brittany Hartman ) DOB: ( [birth year]  ) Appointment date/time( 11-21-2016 / 1000am  ), arrive time( 930am )and who it is with here( Dr. Allena Katz ) [address]

## 2016-11-12 NOTE — Telephone Encounter (Signed)
2nd attempt to reach Brittany Hartman. Mailbox full and not accepting messages

## 2016-11-13 NOTE — Telephone Encounter (Signed)
Transitional Care call  Patient name: (Brittany Hartman) DOB: (01-Jan-1966)  1. Are you/is patient experiencing any problems since coming home? (NO) a. Are there any questions regarding any aspect of care? (NO) 2. Are there any questions regarding medications administration/dosing? (YES, DID NOT RECEIVE CERTAIN MEDS) a. Are meds being taken as prescribed? (YES) b. "Patient should review meds with caller to confirm"  3. Have there been any falls? (NO) 4. Has Home Health been to the house and/or have they contacted you? (NO) a. If not, have you tried to contact them? (NO) b. Can we help you contact them? (YES) 5. Are bowels and bladder emptying properly? (YES) a. Are there any unexpected incontinence issues? (NO) b. If applicable, is patient following bowel/bladder programs? (NO) 6. Any fevers, problems with breathing, unexpected pain? (NO) 7. Are there any skin problems or new areas of breakdown? (NO) 8. Has the patient/family member arranged specialty MD follow up (ie cardiology/neurology/renal/surgical/etc.)?  (YES) a. Can we help arrange? (NO) 9. Does the patient need any other services or support that we can help arrange? (NO) 10. Are caregivers following through as expected in assisting the patient? (NO) 11. Has the patient quit smoking, drinking alcohol, or using drugs as recommended? (NA)  Appointment date/time (11-21-2016 / 1000am), arrive time(930am) and who it is with here (Dr. Allena Katz) [phone]

## 2016-11-21 ENCOUNTER — Encounter: Payer: Self-pay | Admitting: Physical Medicine & Rehabilitation

## 2018-03-20 IMAGING — CT CT HEAD W/O CM
3 of 4 series · 16 of 47 positions shown, 19 images · non-contrast
Comparison: None.

CLINICAL DATA: Lightheadedness and vertigo.

EXAM:
CT HEAD WITHOUT CONTRAST
TECHNIQUE: Contiguous axial images were obtained from the base of the skull
through the vertex without intravenous contrast.

[Series 201: head w/o, idose (1) · axial · non-contrast · 0.49mm/px · z∈[+125,+270]mm · 10 of 35 slices shown, 13 images]
[im 3/35  brain]
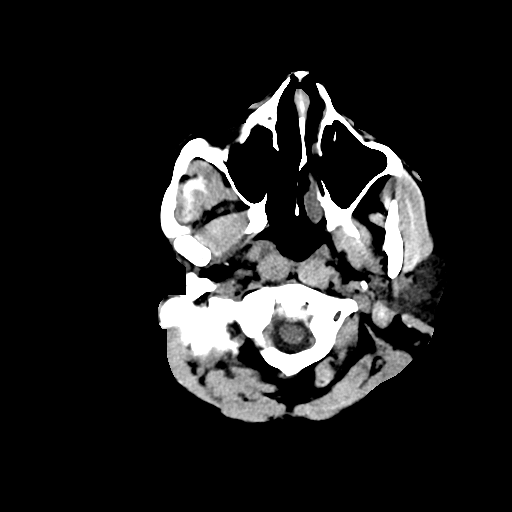
[im 3/35  bone]
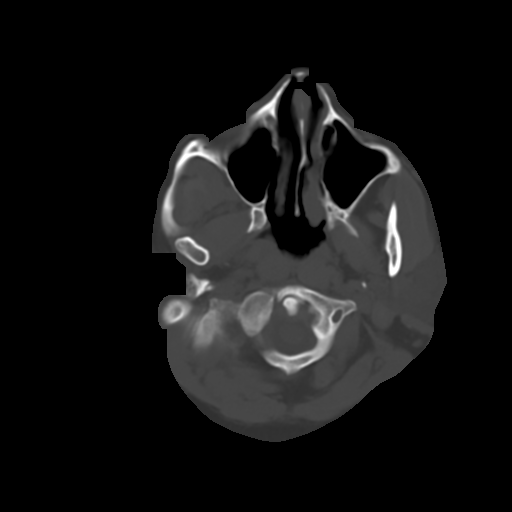
[im 5/35  brain]
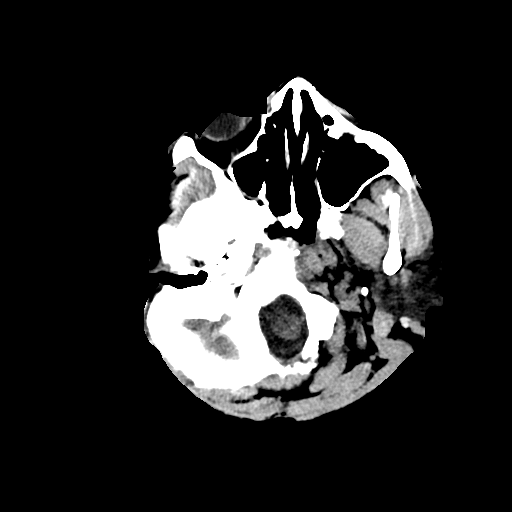
[im 10/35  brain]
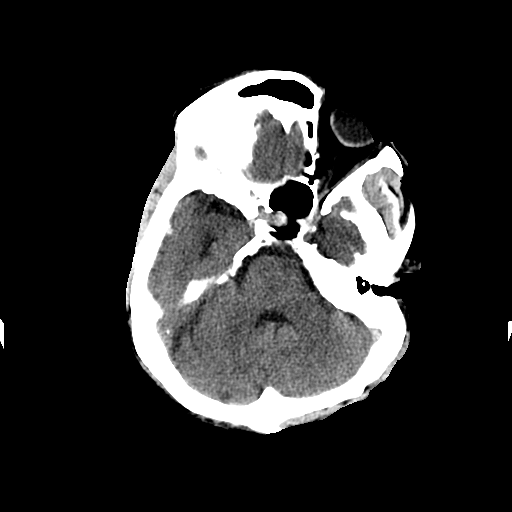
[im 13/35  brain]
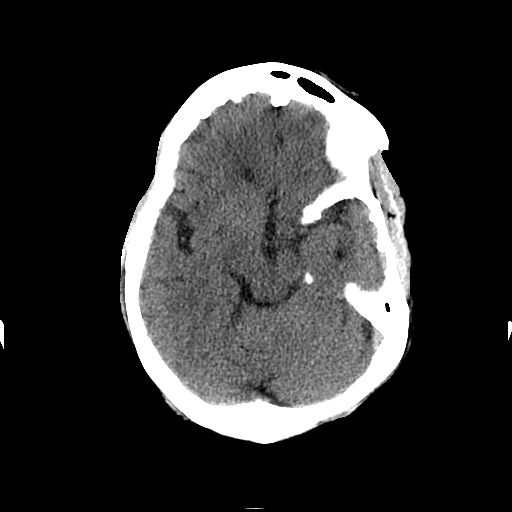
[im 15/35  brain]
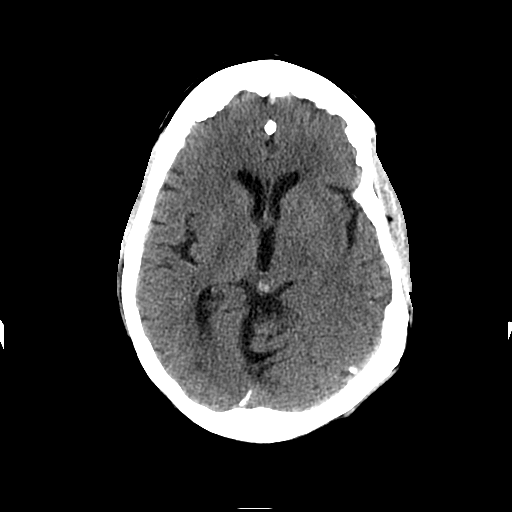
[im 15/35  bone]
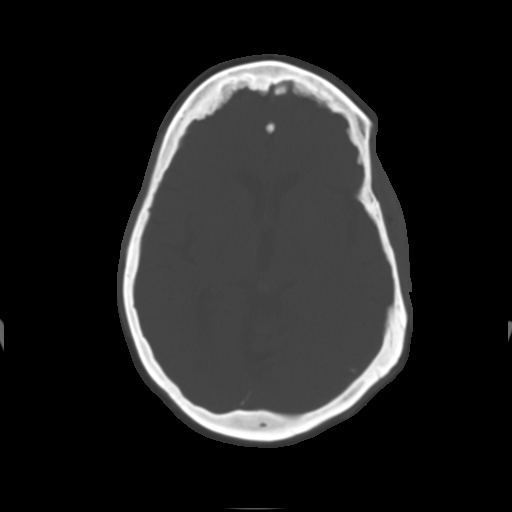
[im 20/35  brain]
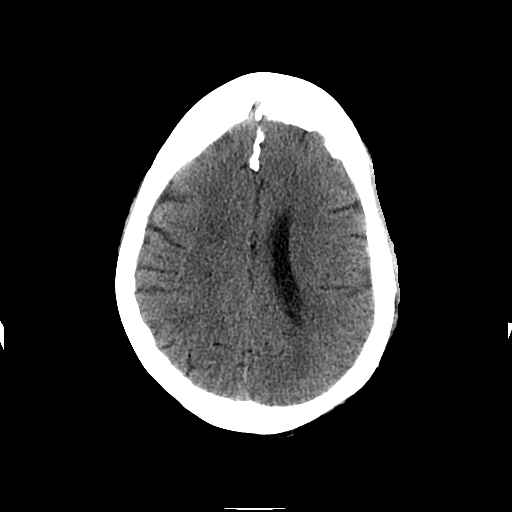
[im 22/35  brain]
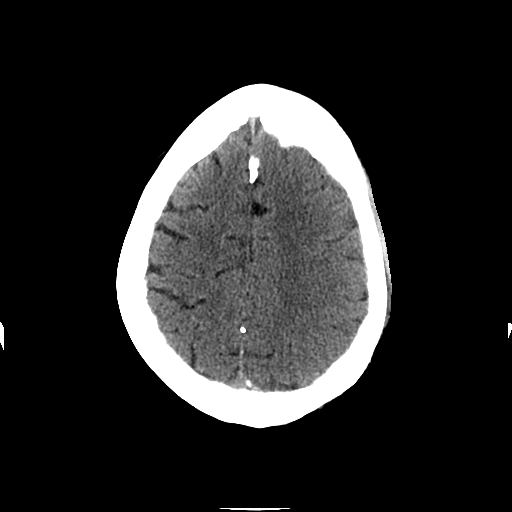
[im 25/35  brain]
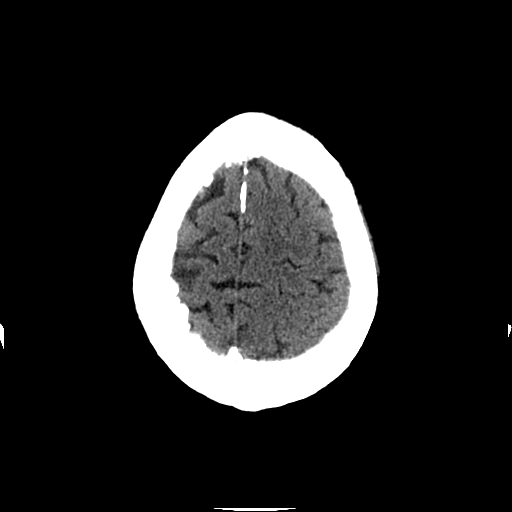
[im 30/35  brain]
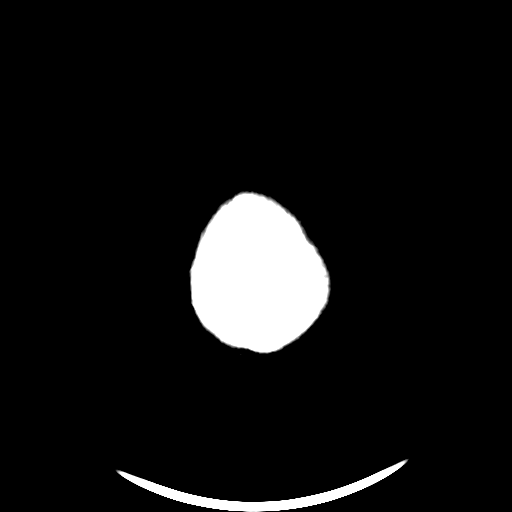
[im 30/35  bone]
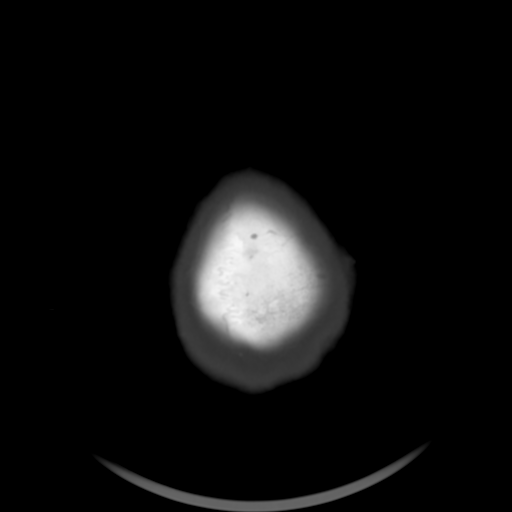
[im 32/35  brain]
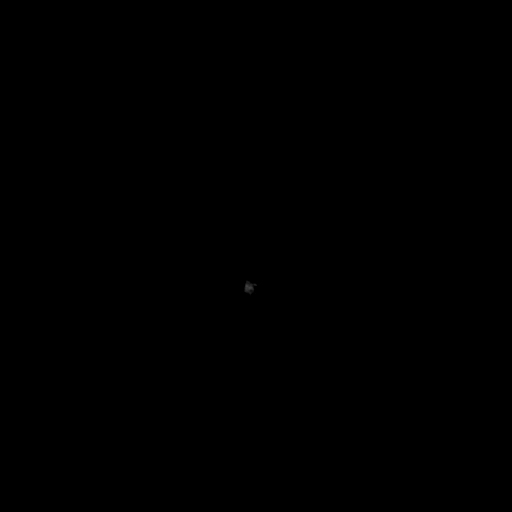

[Series 204: sagittal st, idose (1) · sagittal · 0.40mm/px · 3 of 83 slices shown]
[im 28/83  brain]
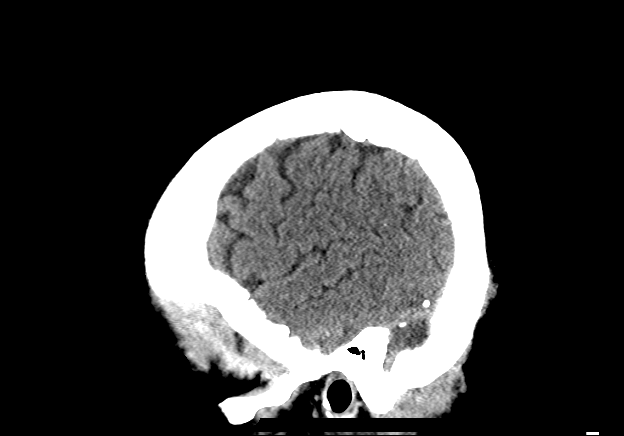
[im 42/83  brain]
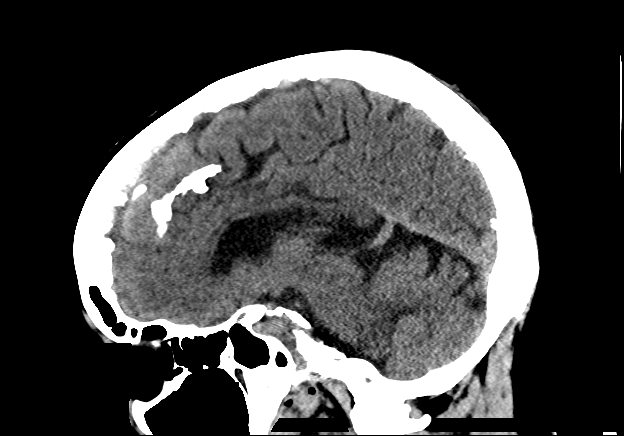
[im 55/83  brain]
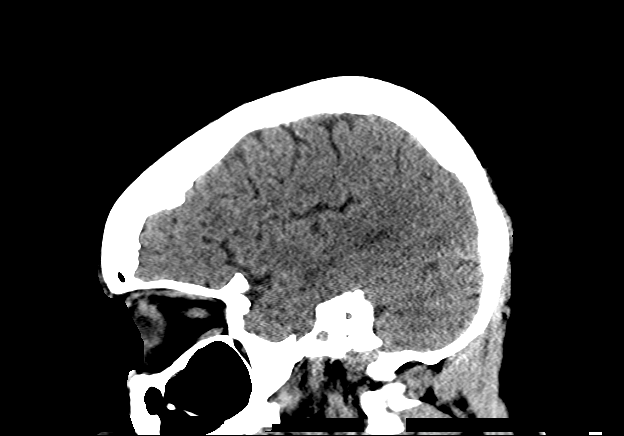

[Series 205: coronal st, idose (1) · coronal · 0.41mm/px · 3 of 74 slices shown]
[im 25/74  brain]
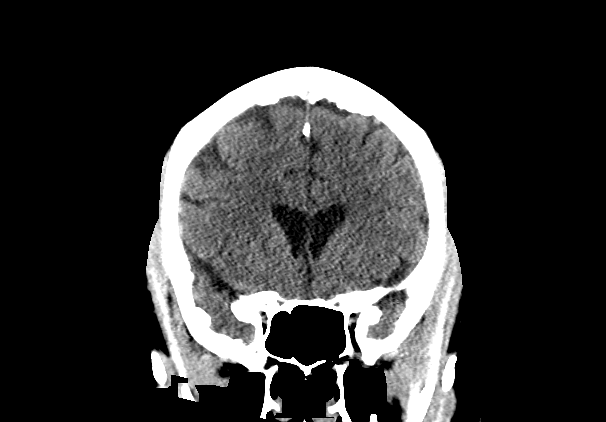
[im 33/74  brain]
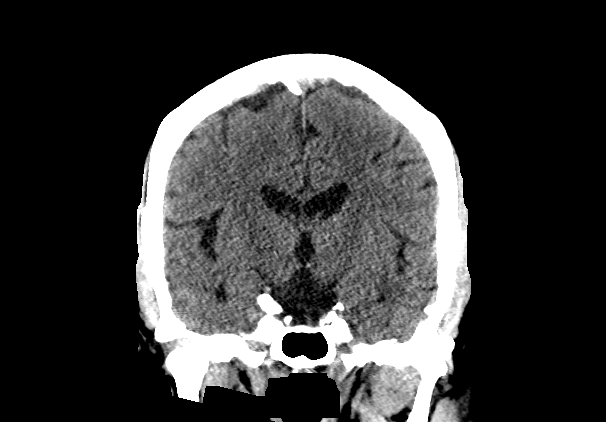
[im 41/74  brain]
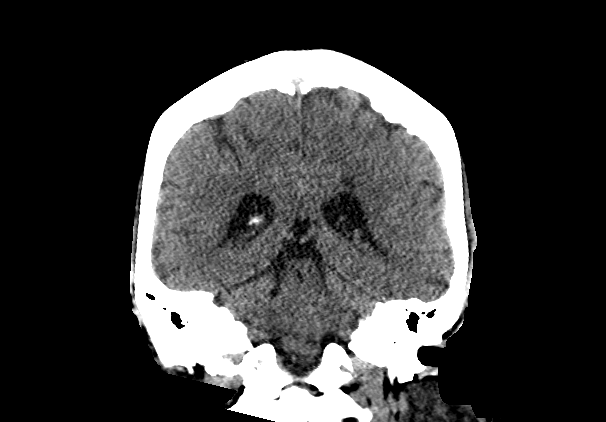

[16 of 47 positions shown; findings below may reference images not displayed]

FINDINGS: Brain:

There is no intracranial hemorrhage, mass or evidence of acute
infarction. There is mild generalized atrophy. There is mild chronic
microvascular ischemic change. There is no significant extra-axial
fluid collection. Benign hyperostosis calcifications incidentally
noted.

No acute intracranial findings are evident.

Vascular: No hyperdense vessel or unexpected calcification.

Skull: Normal. Negative for fracture or focal lesion.

Sinuses/Orbits: No acute finding.

Other: None.
IMPRESSION: No acute intracranial findings. There is mild generalized atrophy
and chronic appearing white matter hypodensities which likely
represent small vessel ischemic disease.

## 2022-06-15 DEATH — deceased
